# Patient Record
Sex: Male | Born: 1990 | Race: Black or African American | Hispanic: No | Marital: Single | Smoking: Never smoker
Health system: Southern US, Community
[De-identification: ages and names within clinical notes are randomized; demographics above are authoritative.]

---
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
## 2019-05-24 ENCOUNTER — Emergency Department
Admission: EM | Admit: 2019-05-24 | Discharge: 2019-05-24 | Disposition: A | Discharge status: Home / Self Care | Payer: No Typology Code available for payment source | Attending: Emergency Medicine | Admitting: Emergency Medicine

## 2019-05-24 ENCOUNTER — Other Ambulatory Visit: Payer: Self-pay

## 2019-05-24 ENCOUNTER — Encounter: Payer: Self-pay | Admitting: Emergency Medicine

## 2019-05-24 ENCOUNTER — Emergency Department: Payer: No Typology Code available for payment source

## 2019-05-24 DIAGNOSIS — U071 COVID-19: Secondary | ICD-10-CM | POA: Diagnosis not present

## 2019-05-24 DIAGNOSIS — M791 Myalgia, unspecified site: Secondary | ICD-10-CM | POA: Diagnosis present

## 2019-05-24 DIAGNOSIS — B349 Viral infection, unspecified: Secondary | ICD-10-CM

## 2019-05-24 LAB — CBC WITH DIFFERENTIAL/PLATELET
Abs Immature Granulocytes: 0.01 10*3/uL (ref 0.00–0.07)
Basophils Absolute: 0 10*3/uL (ref 0.0–0.1)
Basophils Relative: 0 %
Eosinophils Absolute: 0 10*3/uL (ref 0.0–0.5)
Eosinophils Relative: 0 %
HCT: 47.1 % (ref 39.0–52.0)
Hemoglobin: 15.1 g/dL (ref 13.0–17.0)
Immature Granulocytes: 0 %
Lymphocytes Relative: 18 %
Lymphs Abs: 0.8 10*3/uL (ref 0.7–4.0)
MCH: 25.9 pg — ABNORMAL LOW (ref 26.0–34.0)
MCHC: 32.1 g/dL (ref 30.0–36.0)
MCV: 80.9 fL (ref 80.0–100.0)
Monocytes Absolute: 0.8 10*3/uL (ref 0.1–1.0)
Monocytes Relative: 18 %
Neutro Abs: 2.9 10*3/uL (ref 1.7–7.7)
Neutrophils Relative %: 64 %
Platelets: 185 10*3/uL (ref 150–400)
RBC: 5.82 MIL/uL — ABNORMAL HIGH (ref 4.22–5.81)
RDW: 13.9 % (ref 11.5–15.5)
WBC: 4.5 10*3/uL (ref 4.0–10.5)
nRBC: 0 % (ref 0.0–0.2)

## 2019-05-24 LAB — BASIC METABOLIC PANEL
Anion gap: 9 (ref 5–15)
BUN: 10 mg/dL (ref 6–20)
CO2: 25 mmol/L (ref 22–32)
Calcium: 9 mg/dL (ref 8.9–10.3)
Chloride: 101 mmol/L (ref 98–111)
Creatinine, Ser: 0.97 mg/dL (ref 0.61–1.24)
GFR calc Af Amer: >60 mL/min (ref >60)
GFR calc non Af Amer: >60 mL/min (ref >60)
Glucose, Bld: 98 mg/dL (ref 70–99)
Potassium: 3.8 mmol/L (ref 3.5–5.1)
Sodium: 135 mmol/L (ref 135–145)

## 2019-05-24 LAB — TSH: TSH: 1.438 u[IU]/mL (ref 0.350–4.500)

## 2019-05-24 MED ORDER — KETOROLAC TROMETHAMINE 30 MG/ML IJ SOLN
30.0000 mg | Freq: Once | INTRAMUSCULAR | Status: AC
  Administered 2019-05-24: 15:00:00 30 mg via INTRAVENOUS
  Filled 2019-05-24: qty 1

## 2019-05-24 NOTE — ED Provider Notes (Signed)
[facility] Emergency Department Provider Note   ____________________________________________   First MD Initiated Contact with Patient 05/24/19 1410     (approximate)  I have reviewed the triage vital signs and the nursing notes.   HISTORY  Chief Complaint Generalized Body Aches    HPI Nicholas Flynn is a 29 y.o. male patient complain of joint pain and fatigue for few days.  Patient also complain of pruritus.  Patient denies cough or fever.  Patient denies nausea, vomiting, diarrhea.  Patient describes his pain as "achy".  Patient state pruritus that increases when his clothes rub against the skin.  No palliative measure for complaint.  Patient denies recent travel or known exposure to COVID-19.        History reviewed. No pertinent past medical history.  There are no problems to display for this patient.     Prior to Admission medications   Not on File    Allergies Patient has no known allergies.  No family history on file.  Social History Social History   Tobacco Use  . Smoking status: Never Smoker  . Smokeless tobacco: Never Used  Substance Use Topics  . Alcohol use: Yes  . Drug use: Not on file    Review of Systems Constitutional: No fever/chills Eyes: No visual changes. ENT: No sore throat. Cardiovascular: Denies chest pain. Respiratory: Denies shortness of breath. Gastrointestinal: No abdominal pain.  No nausea, no vomiting.  No diarrhea.  No constipation. Genitourinary: Negative for dysuria. Musculoskeletal: Negative for back pain. Skin: Negative for rash. Neurological: Negative for headaches, focal weakness or numbness.   ____________________________________________   PHYSICAL EXAM:  VITAL SIGNS: ED Triage Vitals  Enc Vitals Group     BP      Pulse      Resp      Temp      Temp src      SpO2      Weight      Height      Head Circumference      Peak Flow      Pain Score      Pain Loc      Pain  Edu?      Excl. in [place]?    Constitutional: Alert and oriented. Well appearing and in no acute distress. Neck: No stridor.  No cervical spine tenderness to palpation. Hematological/Lymphatic/Immunilogical: No cervical lymphadenopathy. Cardiovascular: Normal rate, regular rhythm. Grossly normal heart sounds.  Good peripheral circulation. Respiratory: Normal respiratory effort.  No retractions. Lungs CTAB. Gastrointestinal: Soft and nontender. No distention. No abdominal bruits. No CVA tenderness. Musculoskeletal: No lower extremity tenderness nor edema.  No joint effusions. Neurologic:  Normal speech and language. No gross focal neurologic deficits are appreciated. No gait instability. Skin:  Skin is warm, dry and intact. No rash noted. Psychiatric: Mood and affect are normal. Speech and behavior are normal.  ____________________________________________   LABS (all labs ordered are listed, but only abnormal results are displayed)  Labs Reviewed  CBC WITH DIFFERENTIAL/PLATELET - Abnormal; Notable for the following components:      Result Value   RBC 5.82 (*)    MCH 25.9 (*)    All other components within normal limits  SARS CORONAVIRUS 2 (TAT 6-24 HRS)  BASIC METABOLIC PANEL  TSH   ____________________________________________  EKG   ____________________________________________  RADIOLOGY  ED MD interpretation:    Official radiology report(s): DG Chest 2 View  Result Date: 05/24/2019 CLINICAL DATA:  Fatigue EXAM: CHEST - 2  VIEW COMPARISON:  November 30, 2008 FINDINGS: Lungs are clear. The heart size and pulmonary vascularity are normal. No adenopathy. No bone lesions. IMPRESSION: No edema or consolidation.  No evident adenopathy. Electronically Signed   By: Bretta Bang III M.D.   On: 05/24/2019 14:39    ____________________________________________   PROCEDURES  Procedure(s) performed (including Critical Care):  Procedures    ____________________________________________   INITIAL IMPRESSION / ASSESSMENT AND PLAN / ED COURSE  As part of my medical decision making, I reviewed the following data within the electronic MEDICAL RECORD NUMBER     Patient presents with generalized joint pain and fatigue for few days.  Patient denies URI signs or symptoms.  Discussed negative chest x-ray and lab results with patient.  Patient physical exam is unremarkable.  Patient given discharge care instruction and advised over-the-counter anti-inflammatory medications.  Patient advised self quarantine pending Covid test results.   Nicholas Flynn was evaluated in Emergency Department on 05/24/2019 for the symptoms described in the history of present illness. He was evaluated in the context of the global COVID-19 pandemic, which necessitated consideration that the patient might be at risk for infection with the SARS-CoV-2 virus that causes COVID-19. Institutional protocols and algorithms that pertain to the evaluation of patients at risk for COVID-19 are in a state of rapid change based on information released by regulatory bodies including the CDC and federal and state organizations. These policies and algorithms were followed during the patient's care in the ED.       ____________________________________________   FINAL CLINICAL IMPRESSION(S) / ED DIAGNOSES  Final diagnoses:  Viral illness     ED Discharge Orders    None       Note:  This document was prepared using Dragon voice recognition software and may include unintentional dictation errors.    Joni Reining, PA-C 05/24/19 1545    Emily Filbert, MD 05/25/19 1245

## 2019-05-24 NOTE — Discharge Instructions (Addendum)
Advised self quarantine pending results of COVID-19 test.  You may find the results in the MyChart app.

## 2019-05-24 NOTE — ED Triage Notes (Signed)
Presents with joint pain and fatigue for the past few days  No cough or fever

## 2019-05-25 ENCOUNTER — Telehealth: Payer: Self-pay | Admitting: Emergency Medicine

## 2019-05-25 LAB — SARS CORONAVIRUS 2 (TAT 6-24 HRS): SARS Coronavirus 2: POSITIVE — AB

## 2019-05-25 NOTE — Telephone Encounter (Signed)
Called patient to assure he is awre of covid result.  Left messgae with my number.

## 2019-11-08 ENCOUNTER — Emergency Department
Admission: EM | Admit: 2019-11-08 | Discharge: 2019-11-08 | Disposition: A | Discharge status: Home / Self Care | Payer: No Typology Code available for payment source | Attending: Emergency Medicine | Admitting: Emergency Medicine

## 2019-11-08 ENCOUNTER — Inpatient Hospital Stay
Admission: RE | Admit: 2019-11-08 | Discharge: 2019-11-10 | DRG: 885 | Disposition: A | Discharge status: Home / Self Care | Payer: No Typology Code available for payment source | Source: Intra-hospital | Attending: Psychiatry | Admitting: Psychiatry

## 2019-11-08 ENCOUNTER — Other Ambulatory Visit: Payer: Self-pay

## 2019-11-08 ENCOUNTER — Encounter: Payer: Self-pay | Admitting: Emergency Medicine

## 2019-11-08 ENCOUNTER — Encounter: Payer: Self-pay | Admitting: Psychiatry

## 2019-11-08 DIAGNOSIS — Z818 Family history of other mental and behavioral disorders: Secondary | ICD-10-CM | POA: Diagnosis not present

## 2019-11-08 DIAGNOSIS — U071 COVID-19: Secondary | ICD-10-CM | POA: Diagnosis not present

## 2019-11-08 DIAGNOSIS — Z20822 Contact with and (suspected) exposure to covid-19: Secondary | ICD-10-CM | POA: Diagnosis present

## 2019-11-08 DIAGNOSIS — F332 Major depressive disorder, recurrent severe without psychotic features: Secondary | ICD-10-CM | POA: Diagnosis present

## 2019-11-08 DIAGNOSIS — R45851 Suicidal ideations: Secondary | ICD-10-CM | POA: Insufficient documentation

## 2019-11-08 DIAGNOSIS — G4700 Insomnia, unspecified: Secondary | ICD-10-CM | POA: Diagnosis present

## 2019-11-08 DIAGNOSIS — F99 Mental disorder, not otherwise specified: Secondary | ICD-10-CM | POA: Diagnosis present

## 2019-11-08 LAB — SARS CORONAVIRUS 2 BY RT PCR (HOSPITAL ORDER, PERFORMED IN ~~LOC~~ HOSPITAL LAB): SARS Coronavirus 2: NEGATIVE

## 2019-11-08 LAB — URINE DRUG SCREEN, QUALITATIVE (ARMC ONLY)
Amphetamines, Ur Screen: NOT DETECTED
Barbiturates, Ur Screen: NOT DETECTED
Benzodiazepine, Ur Scrn: NOT DETECTED
Cannabinoid 50 Ng, Ur ~~LOC~~: NOT DETECTED
Cocaine Metabolite,Ur ~~LOC~~: POSITIVE — AB
MDMA (Ecstasy)Ur Screen: NOT DETECTED
Methadone Scn, Ur: NOT DETECTED
Opiate, Ur Screen: NOT DETECTED
Phencyclidine (PCP) Ur S: NOT DETECTED
Tricyclic, Ur Screen: NOT DETECTED

## 2019-11-08 LAB — CBC
HCT: 43.5 % (ref 39.0–52.0)
Hemoglobin: 14.2 g/dL (ref 13.0–17.0)
MCH: 26.7 pg (ref 26.0–34.0)
MCHC: 32.6 g/dL (ref 30.0–36.0)
MCV: 81.8 fL (ref 80.0–100.0)
Platelets: 218 10*3/uL (ref 150–400)
RBC: 5.32 MIL/uL (ref 4.22–5.81)
RDW: 13.7 % (ref 11.5–15.5)
WBC: 4.9 10*3/uL (ref 4.0–10.5)
nRBC: 0 % (ref 0.0–0.2)

## 2019-11-08 LAB — COMPREHENSIVE METABOLIC PANEL
ALT: 29 U/L (ref 0–44)
AST: 22 U/L (ref 15–41)
Albumin: 4.4 g/dL (ref 3.5–5.0)
Alkaline Phosphatase: 73 U/L (ref 38–126)
Anion gap: 9 (ref 5–15)
BUN: 17 mg/dL (ref 6–20)
CO2: 27 mmol/L (ref 22–32)
Calcium: 9.4 mg/dL (ref 8.9–10.3)
Chloride: 102 mmol/L (ref 98–111)
Creatinine, Ser: 0.99 mg/dL (ref 0.61–1.24)
GFR calc Af Amer: >60 mL/min (ref >60)
GFR calc non Af Amer: >60 mL/min (ref >60)
Glucose, Bld: 82 mg/dL (ref 70–99)
Potassium: 4 mmol/L (ref 3.5–5.1)
Sodium: 138 mmol/L (ref 135–145)
Total Bilirubin: 0.8 mg/dL (ref 0.3–1.2)
Total Protein: 7.7 g/dL (ref 6.5–8.1)

## 2019-11-08 LAB — ACETAMINOPHEN LEVEL: Acetaminophen (Tylenol), Serum: <10 ug/mL — ABNORMAL LOW (ref 10–30)

## 2019-11-08 LAB — SALICYLATE LEVEL: Salicylate Lvl: <7 mg/dL — ABNORMAL LOW (ref 7.0–30.0)

## 2019-11-08 LAB — ETHANOL: Alcohol, Ethyl (B): <10 mg/dL (ref <10)

## 2019-11-08 MED ORDER — ACETAMINOPHEN 325 MG PO TABS: 650.0000 mg | ORAL_TABLET | Freq: Four times a day (QID) | ORAL | Status: DC | PRN

## 2019-11-08 MED ORDER — ALUM & MAG HYDROXIDE-SIMETH 200-200-20 MG/5ML PO SUSP: 30.0000 mL | ORAL | Status: DC | PRN

## 2019-11-08 MED ORDER — PNEUMOCOCCAL VAC POLYVALENT 25 MCG/0.5ML IJ INJ
0.5000 mL | INJECTION | INTRAMUSCULAR | Status: DC
  Filled 2019-11-08: qty 0.5

## 2019-11-08 MED ORDER — MAGNESIUM HYDROXIDE 400 MG/5ML PO SUSP: 30.0000 mL | Freq: Every day | ORAL | Status: DC | PRN

## 2019-11-08 MED ORDER — TRAZODONE HCL 50 MG PO TABS
50.0000 mg | ORAL_TABLET | Freq: Every evening | ORAL | Status: DC | PRN
  Administered 2019-11-09: 50 mg via ORAL
  Filled 2019-11-08 (×2): qty 1

## 2019-11-08 NOTE — Tx Team (Signed)
Initial Treatment Plan 11/08/2019 10:55 PM Nicholas Flynn DYJ:[id number]    PATIENT STRESSORS: Substance abuse   PATIENT STRENGTHS: Capable of independent living Wellsite geologist fund of knowledge Motivation for treatment/growth   PATIENT IDENTIFIED PROBLEMS: Depression  Suicidal Ideation  Substance abuse                 DISCHARGE CRITERIA:  Improved stabilization in mood, thinking, and/or behavior  PRELIMINARY DISCHARGE PLAN: Outpatient therapy  PATIENT/FAMILY INVOLVEMENT: This treatment plan has been presented to and reviewed with the patient, Nicholas Flynn, and/or family member, .  The patient and family have been given the opportunity to ask questions and make suggestions.  Shelia Media, RN 11/08/2019, 10:55 PM

## 2019-11-08 NOTE — ED Notes (Addendum)
Informed by offgoing nurse that pt has bed assignment in Behavioral unit of [facility]. Informed report could be called after 8PM

## 2019-11-08 NOTE — Progress Notes (Signed)
Patient admitted to unit. Pleasant and cooperative. Currently denies SI, HI, IVH. Patient reports has struggled with depression since 29 years old, reports at that age he had written a letter to his mom about how sad he was, also reports having therapy around 16 or 17 for sleep and emotional stress. Patient reports having highs and lows most of his life. Reports he went to [employer] and that helped some due to routine. 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 Reports he hit an all time low on this past Saturday and it scared him. Reports he was at work and was having a brain fog, went outside and felt like something was telling him if he could walk into traffic and end his life. Reports he told his mom about it and the only direction they knew to take was to come to the ED. Reports has never been on depression meds before. Pt currently denies SI, but reports feeling sad and depressed. Pt reports has experienced anhedonia lately and that all he does is eat, sleep and drink. Reports his drinking has slowed down but reportedly drinks 3-4 times a week, maybe 6 beer or so or sometimes shots of liquor. Oriented patient to room and unit, snacks provided. Pt reports would like a good night sleep, requested sleep aid. Writer contacted NP, patient was sleep prior to giving med. Skin and contraband search completed and witnessed by Liborio Nixon, MHT. No skin issues noted, no contraband found. Pt remains safe on unit with q 15 min checks.

## 2019-11-08 NOTE — ED Notes (Signed)
Pt dressed out. Pt belongings bag:  1 pair red shoes 1 pair blue socks 1 blue underwear 1 red pants 1 white shirt 1 black hat 1 cellphone 1 wallet with cash 1 set of keys 1 headphones  All belongings placed in pt's own black bag.  Pt has glasses with him.

## 2019-11-08 NOTE — ED Notes (Signed)
Pt given meal tray and drink.

## 2019-11-08 NOTE — ED Notes (Signed)
BMU requests patient to not come down until 9:30

## 2019-11-08 NOTE — ED Provider Notes (Signed)
[facility] Emergency Department Provider Note   ____________________________________________   First MD Initiated Contact with Patient 11/08/19 1532     (approximate)  I have reviewed the triage vital signs and the nursing notes.   HISTORY  Chief Complaint Psychiatric Evaluation    HPI Nicholas Flynn is a 29 y.o. male with no significant past medical history who presents to the ED complaining of suicidal ideation.  Patient reports he has been "sad" for a long time and has had intermittent thoughts of suicide for the past several months.  He states that 2 days ago his thoughts became darker than they ever had before and he actually considered going through with it.  He denies any specific plan but has been unable to focus for the past 2 days due to ongoing thoughts of ending his life.  He admits to regular alcohol consumption, about 3 to 4 days/week, also endorses occasional drug use including marijuana, cocaine, mushrooms, and LSD.  He denies any significant medical history and does not have any medical complaints at this time.        History reviewed. No pertinent past medical history.  There are no problems to display for this patient.   History reviewed. No pertinent surgical history.  Prior to Admission medications   Not on File    Allergies Patient has no known allergies.  History reviewed. No pertinent family history.  Social History Social History   Tobacco Use  . Smoking status: Never Smoker  . Smokeless tobacco: Never Used  Vaping Use  . Vaping Use: Former  Substance Use Topics  . Alcohol use: Yes  . Drug use: Not on file    Review of Systems  Constitutional: No fever/chills Eyes: No visual changes. ENT: No sore throat. Cardiovascular: Denies chest pain. Respiratory: Denies shortness of breath. Gastrointestinal: No abdominal pain.  No nausea, no vomiting.  No diarrhea.  No constipation. Genitourinary: Negative for  dysuria. Musculoskeletal: Negative for back pain. Skin: Negative for rash. Neurological: Negative for headaches, focal weakness or numbness.  Positive for suicidal ideation.  ____________________________________________   PHYSICAL EXAM:  VITAL SIGNS: ED Triage Vitals  Enc Vitals Group     BP 11/08/19 1156 (!) 146/94     Pulse Rate 11/08/19 1156 88     Resp 11/08/19 1156 16     Temp 11/08/19 1156 98.2 F (36.8 C)     Temp Source 11/08/19 1156 Oral     SpO2 11/08/19 1156 100 %     Weight 11/08/19 1153 220 lb (99.8 kg)     Height 11/08/19 1153 5\' 11"  (1.803 m)     Head Circumference --      Peak Flow --      Pain Score 11/08/19 1153 0     Pain Loc --      Pain Edu? --      Excl. in [place]? --     Constitutional: Alert and oriented. Eyes: Conjunctivae are normal. Head: Atraumatic. Nose: No congestion/rhinnorhea. Mouth/Throat: Mucous membranes are moist. Neck: Normal ROM Cardiovascular: Normal rate, regular rhythm. Grossly normal heart sounds. Respiratory: Normal respiratory effort.  No retractions. Lungs CTAB. Gastrointestinal: Soft and nontender. No distention. Genitourinary: deferred Musculoskeletal: No lower extremity tenderness nor edema. Neurologic:  Normal speech and language. No gross focal neurologic deficits are appreciated. Skin:  Skin is warm, dry and intact. No rash noted. Psychiatric: Mood and affect are normal. Speech and behavior are normal.  ____________________________________________   LABS (all labs ordered  are listed, but only abnormal results are displayed)  Labs Reviewed  SALICYLATE LEVEL - Abnormal; Notable for the following components:      Result Value   Salicylate Lvl <7.0 (*)    All other components within normal limits  ACETAMINOPHEN LEVEL - Abnormal; Notable for the following components:   Acetaminophen (Tylenol), Serum <10 (*)    All other components within normal limits  SARS CORONAVIRUS 2 BY RT PCR (HOSPITAL ORDER, PERFORMED IN [facility])  COMPREHENSIVE METABOLIC PANEL  ETHANOL  CBC  URINE DRUG SCREEN, QUALITATIVE ([facility] ONLY)    PROCEDURES  Procedure(s) performed (including Critical Care):  Procedures   ____________________________________________   INITIAL IMPRESSION / ASSESSMENT AND PLAN / ED COURSE       29 year old male with no significant past medical history presents to the ED with increasing depression and thoughts of suicide over the past couple of months, thought more specifically about suicide 2 days ago but denies any specific plan.  He arrives voluntarily and is calm and cooperative, we will maintain voluntary status.  He denies any medical complaints and screening labs are unremarkable, he is medically cleared for psychiatric evaluation.  Patient was evaluated by psychiatry and they will plan for voluntary admission.  The patient has been placed in psychiatric observation due to the need to provide a safe environment for the patient while obtaining psychiatric consultation and evaluation, as well as ongoing medical and medication management to treat the patient's condition.  The patient has not been placed under full IVC at this time.       ____________________________________________   FINAL CLINICAL IMPRESSION(S) / ED DIAGNOSES  Final diagnoses:  Suicidal ideation     ED Discharge Orders    None       Note:  This document was prepared using Dragon voice recognition software and may include unintentional dictation errors.   Chesley Noon, MD 11/08/19 [phone]

## 2019-11-08 NOTE — BH Assessment (Signed)
Assessment Note  Nicholas Flynn is an 29 y.o. male who presents to the [facility] ED voluntarily for treatment. Per triage note, Pt here for SI without plan.  Has been increasing in these thoughts over last couple months.  During TTS assessment pt presents is alert and oriented x 4, anxious but cooperative, and mood-congruent with affect. The pt does not appear to be responding to internal or external stimuli. Neither is the pt presenting with any delusional thinking. Pt confirmed the information provided to the triage RN and identified his main complaint to be depression. Pt reports a hx of depression for "as long as he can remember". Pt reports recent struggles with "intense dark thoughts" the last few days. Pt reports the symptom to start Saturday and to have increased since resulting to his ED visit. Pt describes experiencing thoughts of no longer wanting to live, uncontrollable sobbing and a lack of interest in daily activities for unknown reasons. Pt denies a plan or hx of attempts and stated "I usually can shake off my dark thoughts but for some reason Saturday I just couldn't". Pt reports after talking to his mom regarding his SI and the need for OPT, mom advised him to come to the ED for assistance. Pt denies an INPT/OPT/ MH Hx. Pt reports occasional substance use of cocaine, acid, marijuana, alcohol and mushrooms. Pt identified his primary substance choice to be alcohol and his last consumption to be 2 days ago (2 glasses of wine, couple beers). Pt reports his last use of the other substances listed to be last Thursday but was unsure of the quantity. Pt reports struggles with insomnia for years and to be currently sleeping 3-4 hours a night. Pt reports a family hx of dementia, depression and anxiety but denies a SA hx. Pt denies any current HI/AH/VH and provided his mother Christapher Gillian, [phone]) as his collateral contact.   Per Dr. Smith Robert pt meets criteria for INPT  Diagnosis: Major Depressive  Disorder, Recurrent Episode, Severe   Past Medical History: History reviewed. No pertinent past medical history.  History reviewed. No pertinent surgical history.  Family History: History reviewed. No pertinent family history.  Social History:  reports that he has never smoked. He has never used smokeless tobacco. He reports current alcohol use. No history on file for drug use.  Additional Social History:  Alcohol / Drug Use Pain Medications: see mar Prescriptions: see mar Over the Counter: see mar History of alcohol / drug use?: Yes Substance #1 Name of Substance 1: Marijuana Substance #2 Name of Substance 2: Alcohol Substance #3 Name of Substance 3: Mushrooms & Acid Substance #4 Name of Substance 4: Cocaine  CIWA: CIWA-Ar BP: (!) 146/94 Pulse Rate: 88 COWS:    Allergies: No Known Allergies  Home Medications: (Not in a hospital admission)   OB/GYN Status:  No LMP for male patient.  General Assessment Data Location of Assessment: [facility] ED TTS Assessment: In system Is this a Tele or Face-to-Face Assessment?: Face-to-Face Is this an Initial Assessment or a Re-assessment for this encounter?: Initial Assessment Patient Accompanied by:: N/A Language Other than English: No Living Arrangements: Other (Comment) (Private home) What gender do you identify as?: Male Marital status: Single Maiden name: n/a Pregnancy Status: No Living Arrangements: Parent Can pt return to current living arrangement?: Yes Admission Status: Voluntary Is patient capable of signing voluntary admission?: Yes Referral Source: Self/Family/Friend Insurance type: VA  Medical Screening Exam [facility] Walk-in ONLY) Medical Exam completed: Yes  Crisis Care Plan Living Arrangements:  Parent Legal Guardian:  (self ) Name of Psychiatrist: None reported  Name of Therapist: None reported   Education Status Is patient currently in school?: No Is the patient employed, unemployed or receiving disability?:  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 : No Suicidal Intent: Yes-Currently Present Has patient had any suicidal intent within the past 6 months prior to admission? : No Is patient at risk for suicide?: No Suicidal Plan?: No Has patient had any suicidal plan within the past 6 months prior to admission? : No Access to Means: No What has been your use of drugs/alcohol within the last 12 months?: Alcohol, Cocaine, Acid, Mushrooms, Marijuana  Previous Attempts/Gestures: No How many times?: 0 Other Self Harm Risks: None reported  Triggers for Past Attempts: None known Intentional Self Injurious Behavior: None Family Suicide History: Unknown Recent stressful life event(s): Other (Comment) (Life stressors ) Persecutory voices/beliefs?: No Depression: Yes Depression Symptoms: Insomnia, Loss of interest in usual pleasures Substance abuse history and/or treatment for substance abuse?: No Suicide prevention information given to non-admitted patients: Yes  Risk to Others within the past 6 months Homicidal Ideation: No Does patient have any lifetime risk of violence toward others beyond the six months prior to admission? : No Thoughts of Harm to Others: No Current Homicidal Intent: No Current Homicidal Plan: No Access to Homicidal Means: No Describe Access to Homicidal Means: n/a Identified Victim: n/a History of harm to others?: No Assessment of Violence: None Noted Violent Behavior Description: n/a Does patient have access to weapons?: No Criminal Charges Pending?: Yes Describe Pending Criminal Charges: DUI Does patient have a court date: Yes Court Date: 11/30/19 Is patient on probation?: Unknown  Psychosis Hallucinations: None noted Delusions: None noted  Mental Status Report Appearance/Hygiene: In  scrubs Eye Contact: Fair Motor Activity: Freedom of movement Speech: Logical/coherent Level of Consciousness: Alert Mood: Depressed, Anxious, Pleasant Affect: Anxious, Depressed Anxiety Level: Minimal Thought Processes: Coherent, Relevant Judgement: Partial Orientation: Appropriate for developmental age Obsessive Compulsive Thoughts/Behaviors: None  Cognitive Functioning Concentration: Good Memory: Recent Intact, Remote Intact Is patient IDD: No Insight: Fair Impulse Control: Good Appetite: Good Have you had any weight changes? : No Change Sleep: Decreased Total Hours of Sleep:  (4-5 hrs) Vegetative Symptoms: None     Prior Inpatient Therapy Prior Inpatient Therapy: No Prior Therapy Dates: None reported  Prior Therapy Facilty/Provider(s): None reported  Reason for Treatment: None reported   Prior Outpatient Therapy Prior Outpatient Therapy: No Does patient have an ACCT team?: No Does patient have Intensive In-House Services?  : No Does patient have [facility] services? : Unknown Does patient have P4CC services?: Unknown  ADL Screening (condition at time of admission) Is the patient deaf or have difficulty hearing?: No Does the patient have difficulty seeing, even when wearing glasses/contacts?: No Does the patient have difficulty concentrating, remembering, or making decisions?: No Does the patient have difficulty dressing or bathing?: No Does the patient have difficulty walking or climbing stairs?: No Weakness of Legs: None Weakness of Arms/Hands: None  Home Assistive Devices/Equipment Home Assistive Devices/Equipment: None  Therapy Consults (therapy consults require a physician order) PT Evaluation Needed: No OT Evalulation Needed: No SLP Evaluation Needed: No Abuse/Neglect Assessment (Assessment to be complete while patient is alone) Abuse/Neglect Assessment Can Be Completed: Yes Physical Abuse: Denies Verbal Abuse: Denies Sexual Abuse: Denies  Exploitation  of patient/patient's resources: Denies Self-Neglect: Denies Values / Beliefs Cultural Requests During Hospitalization: None Spiritual Requests During Hospitalization: None Consults Spiritual Care Consult Needed: No Transition of Care Team Consult Needed: No Advance Directives (For Healthcare) Does Patient Have a Medical Advance Directive?: No          Disposition:  Disposition Initial Assessment Completed for this Encounter: Yes Patient referred to: Other (Comment)  On Site Evaluation by:   Reviewed with Physician:    Shanon Ace 11/08/2019 5:25 PM

## 2019-11-08 NOTE — BH Assessment (Signed)
Patient can come down after 8pm  Patient is to be admitted to [facility] BMU by Dr. Toni Amend.  Attending Physician will be. Dr. Toni Amend.   Patient has been assigned to room 303, by [facility] Charge Nurse Demetria, RN.  Intake Paper Work has been signed and placed on patient chart.  ER staff is aware of the admission: 1. Nitchia, ER Secretary  2. Larinda Buttery, ER MD  3. Annette Stable, Patient's Nurse  4. THO Patient Access.

## 2019-11-08 NOTE — ED Notes (Signed)
Pt a/o. Voices having suicidal thoughts in the back of his mind for several months. Pt reports saturday at work he was crying and could not stop thinking about hurting self. Pt denies precipitating event. While pt was talking about how he has been feeling he said that he has always been sad. 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 Pt able to contract for safety at this time.

## 2019-11-08 NOTE — Consult Note (Signed)
[facility] Face-to-Face Psychiatry Consult   Reason for Consult:  Worsening depression dual diagnosis and cannot stay safe   Referring Physician:   ER MD    Patient Identification: Nicholas Flynn MRN:  [id number] Principal Diagnosis:   Major Depression severe recurrent  Polysubstance abuse and dependence   Diagnosis:  Same   AA male now not able to stay safe, voluntary seeking admission for dual diagnosis and where major depression issues are worsening.  Poor coping and social skills   Total Time spent with patient:   30-40 min    Subjective:   Nicholas Flynn is a 29 y.o. male patient admitted with   Issues with major depression and substance issues   HPI:  He feels a few weeks of worsening depressed mood, crying spells hopeless helpless feelings, lack of energy, motivation and related issues and alternates between active and passive SI  But no specific plans   Also has excessive worry, nervousness tension frustration.   No other previous treatment is somewhat vague but admits to hallucinogen, cocaine and at times meth and Marijuana use.  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 No pertinent past medical history. History reviewed. No pertinent surgical history. Family History: History reviewed. No pertinent family history. Family Psychiatric  History:  Social History:  Social History   Substance and Sexual Activity  Alcohol Use Yes     Social History   Substance and Sexual Activity  Drug Use Not on file     Social History   Socioeconomic History  . Marital status: Single    Spouse name: Not on file  . Number of children: Not on file  . Years of education: Not on file  . Highest education level: Not on file  Occupational History  . Not on file  Tobacco Use  . Smoking status: Never Smoker  . Smokeless tobacco: Never Used  Vaping Use  . Vaping Use: Former  Substance and Sexual Activity  . Alcohol use: Yes  . Drug use: Not on file  . Sexual activity: Not on file  Other Topics Concern  . Not on file  Social History Narrative  . Not on file   Social Determinants of Health   Financial Resource Strain:   . Difficulty of Paying Living Expenses:   Food Insecurity:   . Worried About Programme researcher, broadcasting/film/video in the Last Year:   . Barista in the Last Year:   Transportation Needs:   . Freight forwarder (Medical):   Marland Kitchen Lack of Transportation (Non-Medical):   Physical Activity:   . Days of Exercise per Week:   . Minutes of Exercise per Session:   Stress:   . Feeling of Stress :   Social Connections:   . Frequency of Communication with Friends and Family:   . Frequency of Social Gatherings with Friends and Family:   . Attends Religious Services:   . Active Member of Clubs or Organizations:   . Attends Club or  Organization Meetings:   Marland Kitchen Marital Status:    Additional Social History:    Allergies:  No Known Allergies  Labs:  Results for orders placed or performed during the hospital encounter of 11/08/19 (from the past 48 hour(s))  Comprehensive metabolic panel     Status: None   Collection Time: 11/08/19 12:00 PM  Result Value Ref Range   Sodium 138 135 - 145 mmol/L   Potassium 4.0 3.5 - 5.1 mmol/L   Chloride 102 98 - 111 mmol/L   CO2 27 22 - 32 mmol/L   Glucose, Bld 82 70 - 99 mg/dL    Comment: Glucose reference range applies only to samples taken after fasting for at least 8 hours.   BUN 17 6 - 20 mg/dL   Creatinine, Ser 4.23 0.61 - 1.24 mg/dL   Calcium 9.4 8.9 -  53.6 mg/dL   Total Protein 7.7 6.5 - 8.1 g/dL   Albumin 4.4 3.5 - 5.0 g/dL   AST 22 15 - 41 U/L   ALT 29 0 - 44 U/L   Alkaline Phosphatase 73 38 - 126 U/L   Total Bilirubin 0.8 0.3 - 1.2 mg/dL   GFR calc non Af Amer >60 >60 mL/min   GFR calc Af Amer >60 >60 mL/min   Anion gap 9 5 - 15    Comment: Performed at [facility], [address] [place], [place] [address]  Ethanol     Status: None   Collection Time: 11/08/19 12:00 PM  Result Value Ref Range   Alcohol, Ethyl (B) <10 <10 mg/dL    Comment: (NOTE) Lowest detectable limit for serum alcohol is 10 mg/dL.  For medical purposes only. Performed at [facility], [address] [place], [place] [address]   Salicylate level     Status: Abnormal   Collection Time: 11/08/19 12:00 PM  Result Value Ref Range   Salicylate Lvl <7.0 (L) 7.0 - 30.0 mg/dL    Comment: Performed at [facility], [address] [place], [place] [address]  Acetaminophen level     Status: Abnormal   Collection Time: 11/08/19 12:00 PM  Result Value Ref Range   Acetaminophen (Tylenol), Serum <10 (L) 10 - 30 ug/mL    Comment: (NOTE) Therapeutic concentrations vary significantly. A range of 10-30 ug/mL  may be an effective concentration for many patients. However, some  are best treated at concentrations outside of this range. Acetaminophen concentrations >150 ug/mL at 4 hours after ingestion  and >50 ug/mL at 12 hours after ingestion are often associated with  toxic reactions.  Performed at [facility], [address] [place], [place] [address]   cbc     Status: None   Collection Time: 11/08/19 12:00 PM  Result Value Ref Range   WBC 4.9 4.0 - 10.5 K/uL   RBC 5.32 4.22 - 5.81 MIL/uL   Hemoglobin 14.2 13.0 - 17.0 g/dL   HCT 67.1 39 - 52 %   MCV 81.8 80.0 - 100.0 fL   MCH 26.7 26.0 - 34.0 pg   MCHC 32.6 30.0 - 36.0 g/dL   RDW 24.5 80.9 - 98.3 %   Platelets 218 150 - 400 K/uL   nRBC 0.0 0.0 - 0.2 %     Comment: Performed at [facility], [address] [place], [place] [address]    No current facility-administered medications for this encounter.   No current outpatient medications on file.    Musculoskeletal: Strength & Muscle Tone:  Normal  Gait & Station: normal  Patient leans:  N/a   Psychiatric Specialty Exam: Physical Exam  Review of Systems  Blood pressure (!) 146/94, pulse 88, temperature 98.2 F (36.8 C), temperature source Oral, resp. rate 16, height 5\' 11"  (1.803 m), weight 99.8 kg, SpO2 100 %.Body mass index is 30.68 kg/m.    Mental Status     Alert cooperative oriented to person place date and time  Consciousness not clouded or fluctuant Concentration and attention normal Mood depressed Affect okay No movement problems Thought process and content --no frank delusions illusions, hallucinations --has depressive and anxious themes Judgement insight reliability fair  Memory remote recent and immediate intact through general questions Fund of knowledge intelligence average to below average Abstraction normal   Speech normal rate tone volume fluency SI and HI --unclear safety margin, strong enough for admission on voluntary basis,   Discharge could put him at risk                                                    Treatment Plan Summary:  AA male with dual diagnosis seeks voluntary admission for depression and substance use   Disposition:  Most likely admission to our unit here at [facility]   [facility], MD 11/08/2019 3:44 PM

## 2019-11-08 NOTE — ED Notes (Signed)
Pt given phone to update mom on plan

## 2019-11-08 NOTE — ED Triage Notes (Signed)
Pt here for SI without plan.  Has been increasing in these thoughts over last couple months.  NAD.

## 2019-11-09 DIAGNOSIS — F332 Major depressive disorder, recurrent severe without psychotic features: Principal | ICD-10-CM

## 2019-11-09 MED ORDER — FLUOXETINE HCL 20 MG PO CAPS
20.0000 mg | ORAL_CAPSULE | Freq: Every day | ORAL | Status: DC
  Administered 2019-11-09 – 2019-11-10 (×2): 20 mg via ORAL
  Filled 2019-11-09 (×2): qty 1

## 2019-11-09 NOTE — H&P (Signed)
Psychiatric Admission Assessment Adult  Patient Identification: Nicholas Flynn MRN:  [id number] Date of Evaluation:  11/09/2019 Chief Complaint:  Severe recurrent major depression without psychotic features (HCC) [F33.2] Principal Diagnosis: Severe recurrent major depression without psychotic features (HCC) Diagnosis:  Principal Problem:   Severe recurrent major depression without psychotic features (HCC)  History of Present Illness: Patient seen and chart reviewed.  29 year old man presented voluntarily to the emergency room seeking mental health treatment.  He reports to me that he has had symptoms of depression going back to adolescence.  Always has felt down and negative much of the time.  The last few months for what ever reason he has been feeling worse.  Mood stays nervous down and hopeless much of the time.  Energy is lower.  Feels a lack of motivation and some hopelessness.  On Saturday at work for reasons he cannot define he felt significantly worse and was having suicidal thoughts but did not act on it.  He is not currently receiving any mental health treatment.  He reports that he drinks a few times a week and drinks a fair bit when he does drink but that is less than he used to.  Denies using other drugs regularly although he occasionally uses hallucinogens.  He did have cocaine in his drug screen.  Patient does not have very much in the way of objective life stresses.  He is working steadily and has a decent place to live and supportive family. Associated Signs/Symptoms: Depression Symptoms:  depressed mood, insomnia, feelings of worthlessness/guilt, difficulty concentrating, hopelessness, suicidal thoughts without plan, anxiety, loss of energy/fatigue, disturbed sleep, (Hypo) Manic Symptoms:  None reported Anxiety Symptoms:  Excessive Worry, Psychotic Symptoms:  None reported PTSD Symptoms: Negative Total Time spent with patient: 1 hour  Past Psychiatric History: Patient  says he saw a therapist as a teenager.  No hospitalization no suicide attempts.  No previous medication.  He says he attended AA meetings a few times while he was in the [employer].  Has not had any psychiatric treatment since then.  Is the patient at risk to self? Yes.    Has the patient been a risk to self in the past 6 months? Yes.    Has the patient been a risk to self within the distant past? Yes.    Is the patient a risk to others? No.  Has the patient been a risk to others in the past 6 months? No.  Has the patient been a risk to others within the distant past? No.   Prior Inpatient Therapy:   Prior Outpatient Therapy:    Alcohol Screening: 1. How often do you have a drink containing alcohol?: 2 to 3 times a week 2. How many drinks containing alcohol do you have on a typical day when you are drinking?: 5 or 6 3. How often do you have six or more drinks on one occasion?: Less than monthly AUDIT-C Score: 6 4. How often during the last year have you found that you were not able to stop drinking once you had started?: Never 5. How often during the last year have you failed to do what was normally expected from you because of drinking?: Never 6. How often during the last year have you needed a first drink in the morning to get yourself going after a heavy drinking session?: Never 7. How often during the last year have you had a feeling of guilt of remorse after drinking?: Never 8. How often during the last  year have you been unable to remember what happened the night before because you had been drinking?: Never 9. Have you or someone else been injured as a result of your drinking?: No 10. Has a relative or friend or a doctor or another health worker been concerned about your drinking or suggested you cut down?: Yes, but not in the last year Alcohol Use Disorder Identification Test Final Score (AUDIT): 8 Alcohol Brief Interventions/Follow-up: Brief Advice Substance Abuse History in the last  12 months:  Yes.   Consequences of Substance Abuse: Negative Previous Psychotropic Medications: No  Psychological Evaluations: No  Past Medical History: History reviewed. No pertinent past medical history. History reviewed. No pertinent surgical history. Family History: History reviewed. No pertinent family history. Family Psychiatric  History: Patient reports a pretty extensive family history of depression and anxiety but no known suicide attempts no history of psychosis Tobacco Screening: Have you used any form of tobacco in the last 30 days? (Cigarettes, Smokeless Tobacco, Cigars, and/or Pipes): Yes Tobacco use, Select all that apply: smokeless tobacco use daily Are you interested in Tobacco Cessation Medications?: No, patient refused Counseled patient on smoking cessation including recognizing danger situations, developing coping skills and basic information about quitting provided: Yes Social History:  Social History   Substance and Sexual Activity  Alcohol Use Yes     Social History   Substance and Sexual Activity  Drug Use Not on file    Additional Social History: Marital status: Divorced Divorced, when?: "2014" What types of issues is patient dealing with in the relationship?: "she just changed her mind" Does patient have children?: No                         Allergies:  No Known Allergies Lab Results:  Results for orders placed or performed during the hospital encounter of 11/08/19 (from the past 48 hour(s))  Comprehensive metabolic panel     Status: None   Collection Time: 11/08/19 12:00 PM  Result Value Ref Range   Sodium 138 135 - 145 mmol/L   Potassium 4.0 3.5 - 5.1 mmol/L   Chloride 102 98 - 111 mmol/L   CO2 27 22 - 32 mmol/L   Glucose, Bld 82 70 - 99 mg/dL    Comment: Glucose reference range applies only to samples taken after fasting for at least 8 hours.   BUN 17 6 - 20 mg/dL   Creatinine, Ser 1.61 0.61 - 1.24 mg/dL   Calcium 9.4 8.9 - 09.6 mg/dL    Total Protein 7.7 6.5 - 8.1 g/dL   Albumin 4.4 3.5 - 5.0 g/dL   AST 22 15 - 41 U/L   ALT 29 0 - 44 U/L   Alkaline Phosphatase 73 38 - 126 U/L   Total Bilirubin 0.8 0.3 - 1.2 mg/dL   GFR calc non Af Amer >60 >60 mL/min   GFR calc Af Amer >60 >60 mL/min   Anion gap 9 5 - 15    Comment: Performed at [facility], [address] [place], [place] [address]  Ethanol     Status: None   Collection Time: 11/08/19 12:00 PM  Result Value Ref Range   Alcohol, Ethyl (B) <10 <10 mg/dL    Comment: (NOTE) Lowest detectable limit for serum alcohol is 10 mg/dL.  For medical purposes only. Performed at [facility], [address] [place], [place] [address]   Salicylate level     Status: Abnormal  Collection Time: 11/08/19 12:00 PM  Result Value Ref Range   Salicylate Lvl <7.0 (L) 7.0 - 30.0 mg/dL    Comment: Performed at [facility], [address] [place], [place] [address]  Acetaminophen level     Status: Abnormal   Collection Time: 11/08/19 12:00 PM  Result Value Ref Range   Acetaminophen (Tylenol), Serum <10 (L) 10 - 30 ug/mL    Comment: (NOTE) Therapeutic concentrations vary significantly. A range of 10-30 ug/mL  may be an effective concentration for many patients. However, some  are best treated at concentrations outside of this range. Acetaminophen concentrations >150 ug/mL at 4 hours after ingestion  and >50 ug/mL at 12 hours after ingestion are often associated with  toxic reactions.  Performed at [facility], [address] [place], [place] [address]   cbc     Status: None   Collection Time: 11/08/19 12:00 PM  Result Value Ref Range   WBC 4.9 4.0 - 10.5 K/uL   RBC 5.32 4.22 - 5.81 MIL/uL   Hemoglobin 14.2 13.0 - 17.0 g/dL   HCT 09.843.5 39 - 52 %   MCV 81.8 80.0 - 100.0 fL   MCH 26.7 26.0 - 34.0 pg   MCHC 32.6 30.0 - 36.0 g/dL   RDW 11.913.7 14.711.5 - 82.915.5 %   Platelets 218 150 - 400 K/uL   nRBC 0.0 0.0 - 0.2 %    Comment: Performed  at [facility], [address] [place], [place] [address]  SARS Coronavirus 2 by RT PCR (hospital order, performed in [facility] hospital lab) Nasopharyngeal Nasopharyngeal Swab     Status: None   Collection Time: 11/08/19  5:19 PM   Specimen: Nasopharyngeal Swab  Result Value Ref Range   SARS Coronavirus 2 NEGATIVE NEGATIVE    Comment: (NOTE) SARS-CoV-2 target nucleic acids are NOT DETECTED.  The SARS-CoV-2 RNA is generally detectable in upper and lower respiratory specimens during the acute phase of infection. The lowest concentration of SARS-CoV-2 viral copies this assay can detect is 250 copies / mL. A negative result does not preclude SARS-CoV-2 infection and should not be used as the sole basis for treatment or other patient management decisions.  A negative result may occur with improper specimen collection / handling, submission of specimen other than nasopharyngeal swab, presence of viral mutation(s) within the areas targeted by this assay, and inadequate number of viral copies (<250 copies / mL). A negative result must be combined with clinical observations, patient history, and epidemiological information.  Fact Sheet for Patients:   BoilerBrush.com.cyhttps://www.fda.gov/media/136312/download  Fact Sheet for Healthcare Providers: https://pope.com/https://www.fda.gov/media/136313/download  This test is not yet approved or  cleared by the [place] FDA and has been authorized for detection and/or diagnosis of SARS-CoV-2 by FDA under an Emergency Use Authorization (EUA).  This EUA will remain in effect (meaning this test can be used) for the duration of the COVID-19 declaration under Section 564(b)(1) of the Act, 21 U.S.C. section 360bbb-3(b)(1), unless the authorization is terminated or revoked sooner.  Performed at [facility], [address] [place], [place] [address]   Urine Drug Screen, Qualitative     Status: Abnormal   Collection Time: 11/08/19  5:50 PM  Result Value  Ref Range   Tricyclic, Ur Screen NONE DETECTED NONE DETECTED   Amphetamines, Ur Screen NONE DETECTED NONE DETECTED   MDMA (Ecstasy)Ur Screen NONE DETECTED NONE DETECTED   Cocaine Metabolite,Ur [place] POSITIVE (A) NONE DETECTED   Opiate, Ur Screen NONE  DETECTED NONE DETECTED   Phencyclidine (PCP) Ur S NONE DETECTED NONE DETECTED   Cannabinoid 50 Ng, Ur [place] NONE DETECTED NONE DETECTED   Barbiturates, Ur Screen NONE DETECTED NONE DETECTED   Benzodiazepine, Ur Scrn NONE DETECTED NONE DETECTED   Methadone Scn, Ur NONE DETECTED NONE DETECTED    Comment: (NOTE) Tricyclics + metabolites, urine    Cutoff 1000 ng/mL Amphetamines + metabolites, urine  Cutoff 1000 ng/mL MDMA (Ecstasy), urine              Cutoff 500 ng/mL Cocaine Metabolite, urine          Cutoff 300 ng/mL Opiate + metabolites, urine        Cutoff 300 ng/mL Phencyclidine (PCP), urine         Cutoff 25 ng/mL Cannabinoid, urine                 Cutoff 50 ng/mL Barbiturates + metabolites, urine  Cutoff 200 ng/mL Benzodiazepine, urine              Cutoff 200 ng/mL Methadone, urine                   Cutoff 300 ng/mL  The urine drug screen provides only a preliminary, unconfirmed analytical test result and should not be used for non-medical purposes. Clinical consideration and professional judgment should be applied to any positive drug screen result due to possible interfering substances. A more specific alternate chemical method must be used in order to obtain a confirmed analytical result. Gas chromatography / mass spectrometry (GC/MS) is the preferred confirm atory method. Performed at [facility], [address] [place], [place] [address]     Blood Alcohol level:  Lab Results  Component Value Date   [facility] <10 11/08/2019    Metabolic Disorder Labs:  No results found for: HGBA1C, MPG No results found for: PROLACTIN No results found for: CHOL, TRIG, HDL, CHOLHDL, VLDL, LDLCALC  Current Medications: Current  Facility-Administered Medications  Medication Dose Route Frequency Provider Last Rate Last Admin  . acetaminophen (TYLENOL) tablet 650 mg  650 mg Oral Q6H PRN Stony Stegmann T, MD      . alum & mag hydroxide-simeth (MAALOX/MYLANTA) 200-200-20 MG/5ML suspension 30 mL  30 mL Oral Q4H PRN Gedalia Mcmillon T, MD      . FLUoxetine (PROZAC) capsule 20 mg  20 mg Oral Daily Cherika Jessie T, MD   20 mg at 11/09/19 1515  . magnesium hydroxide (MILK OF MAGNESIA) suspension 30 mL  30 mL Oral Daily PRN Sarahgrace Broman T, MD      . pneumococcal 23 valent vaccine (PNEUMOVAX-23) injection 0.5 mL  0.5 mL Intramuscular Tomorrow-1000 Laetitia Schnepf T, MD      . traZODone (DESYREL) tablet 50 mg  50 mg Oral QHS PRN Gillermo Murdoch, NP       PTA Medications: No medications prior to admission.    Musculoskeletal: Strength & Muscle Tone: within normal limits Gait & Station: normal Patient leans: N/A  Psychiatric Specialty Exam: Physical Exam Vitals and nursing note reviewed.  Constitutional:      Appearance: He is well-developed.  HENT:     Head: Normocephalic and atraumatic.  Eyes:     Conjunctiva/sclera: Conjunctivae normal.     Pupils: Pupils are equal, round, and reactive to light.  Cardiovascular:     Heart sounds: Normal heart sounds.  Pulmonary:     Effort: Pulmonary effort is normal.  Abdominal:     Palpations: Abdomen is  soft.  Musculoskeletal:        General: Normal range of motion.     Cervical back: Normal range of motion.  Skin:    General: Skin is warm and dry.  Neurological:     General: No focal deficit present.     Mental Status: He is alert.  Psychiatric:        Mood and Affect: Mood normal.     Review of Systems  Constitutional: Negative.   HENT: Negative.   Eyes: Negative.   Respiratory: Negative.   Cardiovascular: Negative.   Gastrointestinal: Negative.   Musculoskeletal: Negative.   Skin: Negative.   Neurological: Negative.   Psychiatric/Behavioral: Positive for  dysphoric mood, sleep disturbance and suicidal ideas.    Blood pressure 129/85, pulse 88, temperature 98 F (36.7 C), temperature source Oral, resp. rate 18, height 5\' 11"  (1.803 m), weight 99.3 kg, SpO2 99 %.Body mass index is 30.54 kg/m.  General Appearance: Casual  Eye Contact:  Good  Speech:  Clear and Coherent  Volume:  Normal  Mood:  Dysphoric  Affect:  Congruent  Thought Process:  Coherent  Orientation:  Full (Time, Place, and Person)  Thought Content:  Logical  Suicidal Thoughts:  Yes.  without intent/plan  Homicidal Thoughts:  No  Memory:  Immediate;   Fair Recent;   Fair Remote;   Fair  Judgement:  Fair  Insight:  Fair  Psychomotor Activity:  Normal  Concentration:  Concentration: Fair  Recall:  of Knowledge:  Fair  Language:  Fair  Akathisia:  No  Handed:  Right  AIMS (if indicated):     Assets:  Desire for Improvement Housing Physical Health Social Support  ADL's:  Intact  Cognition:  WNL  Sleep:  Number of Hours: 5    Treatment Plan Summary: Daily contact with patient to assess and evaluate symptoms and progress in treatment, Medication management and Plan Patient with chronic depression with reasoning worsening of symptoms.  No psychosis.  Good insight appropriate cooperation with treatment.  Possibility of substance abuse is still open and he may be minimizing the degree to which this is a problem.  Patient was agreeable to the suggestion of starting fluoxetine 20 mg/day.  Patient will be engaged in individual and group therapy.  Continue 15-minute checks.  Reestablish contact tomorrow and reassess for possible discharge 1 to 2 days.  Observation Level/Precautions:  15 minute checks  Laboratory:  Chemistry Profile  Psychotherapy:    Medications:    Consultations:    Discharge Concerns:    Estimated LOS:  Other:     Physician Treatment Plan for Primary Diagnosis: Severe recurrent major depression without psychotic features (HCC) Long Term  Goal(s): Improvement in symptoms so as ready for discharge  Short Term Goals: Ability to verbalize feelings will improve, Ability to disclose and discuss suicidal ideas and Ability to demonstrate self-control will improve  Physician Treatment Plan for Secondary Diagnosis: Principal Problem:   Severe recurrent major depression without psychotic features (HCC)  Long Term Goal(s): Improvement in symptoms so as ready for discharge  Short Term Goals: Ability to maintain clinical measurements within normal limits will improve and Compliance with prescribed medications will improve  I certify that inpatient services furnished can reasonably be expected to improve the patient's condition.    [employer], MD 6/29/20213:33 PM

## 2019-11-09 NOTE — Tx Team (Addendum)
Interdisciplinary Treatment and Diagnostic Plan Update  11/09/2019 Time of Session: 9:00AM Nicholas Flynn MRN: [id number]  Principal Diagnosis: <principal problem not specified>  Secondary Diagnoses: Active Problems:   Severe recurrent major depression without psychotic features ([place])   Current Medications:  Current Facility-Administered Medications  Medication Dose Route Frequency Provider Last Rate Last Admin  . acetaminophen (TYLENOL) tablet 650 mg  650 mg Oral Q6H PRN Clapacs, John T, MD      . alum & mag hydroxide-simeth (MAALOX/MYLANTA) 200-200-20 MG/5ML suspension 30 mL  30 mL Oral Q4H PRN Clapacs, John T, MD      . magnesium hydroxide (MILK OF MAGNESIA) suspension 30 mL  30 mL Oral Daily PRN Clapacs, John T, MD      . pneumococcal 23 valent vaccine (PNEUMOVAX-23) injection 0.5 mL  0.5 mL Intramuscular Tomorrow-1000 Clapacs, John T, MD      . traZODone (DESYREL) tablet 50 mg  50 mg Oral QHS PRN Caroline Sauger, NP       PTA Medications: No medications prior to admission.    Patient Stressors: Substance abuse  Patient Strengths: Capable of independent living Curator fund of knowledge Motivation for treatment/growth  Treatment Modalities: Medication Management, Group therapy, Case management,  1 to 1 session with clinician, Psychoeducation, Recreational therapy.   Physician Treatment Plan for Primary Diagnosis: <principal problem not specified> Long Term Goal(s):     Short Term Goals:    Medication Management: Evaluate patient's response, side effects, and tolerance of medication regimen.  Therapeutic Interventions: 1 to 1 sessions, Unit Group sessions and Medication administration.  Evaluation of Outcomes: Not Met  Physician Treatment Plan for Secondary Diagnosis: Active Problems:   Severe recurrent major depression without psychotic features ([place])  Long Term Goal(s):     Short Term Goals:       Medication Management: Evaluate  patient's response, side effects, and tolerance of medication regimen.  Therapeutic Interventions: 1 to 1 sessions, Unit Group sessions and Medication administration.  Evaluation of Outcomes: Not Met   RN Treatment Plan for Primary Diagnosis: <principal problem not specified> Long Term Goal(s): Knowledge of disease and therapeutic regimen to maintain health will improve  Short Term Goals: Ability to demonstrate self-control, Ability to participate in decision making will improve, Ability to disclose and discuss suicidal ideas and Ability to identify and develop effective coping behaviors will improve  Medication Management: RN will administer medications as ordered by provider, will assess and evaluate patient's response and provide education to patient for prescribed medication. RN will report any adverse and/or side effects to prescribing provider.  Therapeutic Interventions: 1 on 1 counseling sessions, Psychoeducation, Medication administration, Evaluate responses to treatment, Monitor vital signs and CBGs as ordered, Perform/monitor CIWA, COWS, AIMS and Fall Risk screenings as ordered, Perform wound care treatments as ordered.  Evaluation of Outcomes: Not Met   LCSW Treatment Plan for Primary Diagnosis: <principal problem not specified> Long Term Goal(s): Safe transition to appropriate next level of care at discharge, Engage patient in therapeutic group addressing interpersonal concerns.  Short Term Goals: Engage patient in aftercare planning with referrals and resources, Increase social support, Increase ability to appropriately verbalize feelings, Increase emotional regulation, Facilitate acceptance of mental health diagnosis and concerns and Increase skills for wellness and recovery  Therapeutic Interventions: Assess for all discharge needs, 1 to 1 time with Social worker, Explore available resources and support systems, Assess for adequacy in community support network, Educate family  and significant other(s) on suicide prevention, Complete Psychosocial Assessment, Interpersonal group  therapy.  Evaluation of Outcomes: Not Met   Progress in Treatment: Attending groups: No. Participating in groups: No. Taking medication as prescribed: Yes. Toleration medication: Yes. Family/Significant other contact made: Yes, individual(s) contacted:  once permission is granted. Patient understands diagnosis: Yes. Discussing patient identified problems/goals with staff: Yes. Medical problems stabilized or resolved: Yes. Denies suicidal/homicidal ideation: Yes. Issues/concerns per patient self-inventory: No. Other: none  New problem(s) identified: No, Describe:  none  New Short Term/Long Term Goal(s): medication management for mood stabilization; elimination of SI thoughts; development of comprehensive mental wellness/sobriety plan.  Patient Goals:  "I came here to find someone to talk to and figure out my plan"  Discharge Plan or Barriers: CSW will assist pt in developing appropriate discharge plans.   Reason for Continuation of Hospitalization: Anxiety Depression Medication stabilization Suicidal ideation  Estimated Length of Stay: 1-7 days  Recreational Therapy: Patient: N/A Patient Goal: Patient will engage in groups without prompting or encouragement from LRT x3 group sessions within 5 recreation therapy group sessions.  Attendees: Patient: Nicholas Flynn 11/09/2019 9:16 AM  Physician: Dr. Weber Cooks, MD 11/09/2019 9:16 AM  Nursing: Collier Bullock, RN 11/09/2019 9:16 AM  RN Care Manager: 11/09/2019 9:16 AM  Social Worker: Assunta Curtis 11/09/2019 9:16 AM  Recreational Therapist: Roanna Epley, Reather Converse, LRT 11/09/2019 9:16 AM  Other: Sanjuana Kava, [place] 11/09/2019 9:16 AM  Other:  11/09/2019 9:16 AM  Other: 11/09/2019 9:16 AM    Scribe for Treatment Team: Rozann Lesches, LCSW 11/09/2019 9:16 AM

## 2019-11-09 NOTE — [facility] Suicide Risk Assessment (Signed)
[facility] Admission Suicide Risk Assessment   Nursing information obtained from:    Demographic factors:  Male Current Mental Status:  NA Loss Factors:  NA Historical Factors:  NA Risk Reduction Factors:  Sense of responsibility to family, Positive social support  Total Time spent with patient: 1 hour Principal Problem: Severe recurrent major depression without psychotic features (HCC) Diagnosis:  Principal Problem:   Severe recurrent major depression without psychotic features (HCC)  Subjective Data: Patient seen and chart reviewed.  Patient presented voluntarily with multiple symptoms of depression that have been chronic with worsening the last few months.  Suicidal thought without specific intent or plan.  No psychosis.  Drinking and using other drugs at times unclear how much that is contributing but did have cocaine in his drug screen.  Currently denies suicidal intent  Continued Clinical Symptoms:  Alcohol Use Disorder Identification Test Final Score (AUDIT): 8 The "Alcohol Use Disorders Identification Test", Guidelines for Use in Primary Care, Second Edition.  World Science writer [facility]). Score between 0-7:  no or low risk or alcohol related problems. Score between 8-15:  moderate risk of alcohol related problems. Score between 16-19:  high risk of alcohol related problems. Score 20 or above:  warrants further diagnostic evaluation for alcohol dependence and treatment.   CLINICAL FACTORS:   Depression:   Comorbid alcohol abuse/dependence   Musculoskeletal: Strength & Muscle Tone: within normal limits Gait & Station: normal Patient leans: N/A  Psychiatric Specialty Exam: Physical Exam Vitals and nursing note reviewed.  Constitutional:      Appearance: He is well-developed.  HENT:     Head: Normocephalic and atraumatic.  Eyes:     Conjunctiva/sclera: Conjunctivae normal.     Pupils: Pupils are equal, round, and reactive to light.  Cardiovascular:     Heart sounds:  Normal heart sounds.  Pulmonary:     Effort: Pulmonary effort is normal.  Abdominal:     Palpations: Abdomen is soft.  Musculoskeletal:        General: Normal range of motion.     Cervical back: Normal range of motion.  Skin:    General: Skin is warm and dry.  Neurological:     General: No focal deficit present.     Mental Status: He is alert.  Psychiatric:        Attention and Perception: Attention normal.        Mood and Affect: Mood is depressed.        Speech: Speech normal.        Behavior: Behavior normal.        Thought Content: Thought content normal.        Cognition and Memory: Cognition normal.        Judgment: Judgment normal.     Review of Systems  Constitutional: Negative.   HENT: Negative.   Eyes: Negative.   Respiratory: Negative.   Cardiovascular: Negative.   Gastrointestinal: Negative.   Musculoskeletal: Negative.   Skin: Negative.   Neurological: Negative.   Psychiatric/Behavioral: Positive for dysphoric mood, sleep disturbance and suicidal ideas.    Blood pressure 129/85, pulse 88, temperature 98 F (36.7 C), temperature source Oral, resp. rate 18, height 5\' 11"  (1.803 m), weight 99.3 kg, SpO2 99 %.Body mass index is 30.54 kg/m.  General Appearance: Casual  Eye Contact:  Good  Speech:  Clear and Coherent  Volume:  Normal  Mood:  Depressed  Affect:  Congruent  Thought Process:  Coherent  Orientation:  Full (Time, Place, and Person)  Thought Content:  Logical  Suicidal Thoughts:  No  Homicidal Thoughts:  No  Memory:  Immediate;   Fair Recent;   Fair Remote;   Fair  Judgement:  Fair  Insight:  Fair  Psychomotor Activity:  Normal  Concentration:  Concentration: Fair  Recall:  [employer] of Knowledge:  Fair  Language:  Fair  Akathisia:  No  Handed:  Right  AIMS (if indicated):     Assets:  Desire for Improvement Housing Physical Health Resilience Social Support  ADL's:  Intact  Cognition:  WNL  Sleep:  Number of Hours: 5       COGNITIVE FEATURES THAT CONTRIBUTE TO RISK:  None    SUICIDE RISK:   Minimal: No identifiable suicidal ideation.  Patients presenting with no risk factors but with morbid ruminations; may be classified as minimal risk based on the severity of the depressive symptoms  PLAN OF CARE: Continue 15-minute checks.  Full team assessment.  Discussed with patient the option of starting antidepressant medicine.  Reassess and consider possible discharge as early as tomorrow to outpatient follow-up  I certify that inpatient services furnished can reasonably be expected to improve the patient's condition.   Mordecai Rasmussen, MD 11/09/2019, 3:31 PM
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
## 2019-11-09 NOTE — [facility] Suicide Risk Assessment (Signed)
[facility] INPATIENT:  Family/Significant Other Suicide Prevention Education  Suicide Prevention Education:  Patient Refusal for Family/Significant Other Suicide Prevention Education: The patient Nicholas Flynn has refused to provide written consent for family/significant other to be provided Family/Significant Other Suicide Prevention Education during admission and/or prior to discharge.  Physician notified.  SPE completed with pt, as pt refused to consent to family contact. SPI pamphlet provided to pt and pt was encouraged to share information with support network, ask questions, and talk about any concerns relating to SPE. Pt denies access to guns/firearms and verbalized understanding of information provided. Mobile Crisis information also provided to pt.   Harden Mo 11/09/2019, 10:39 AM

## 2019-11-09 NOTE — Progress Notes (Signed)
Recreation Therapy Notes  INPATIENT RECREATION THERAPY ASSESSMENT  Patient Details Name: Nicholas Flynn MRN: [id number] DOB: 10/07/1990 Today's Date: 11/09/2019       Information Obtained From: Patient  Able to Participate in Assessment/Interview: Yes  Patient Presentation: Responsive  Reason for Admission (Per Patient): Active Symptoms, Suicidal Ideation  Patient Stressors:    Coping Skills:   Substance Abuse, Write, Exercise, Music  Leisure Interests (2+):  Individual - Reading, Social - Friends, Music - Listen, Exercise - Paediatric nurse, Exercise - Running, Exercise - Walking Adriana Simas)  Frequency of Recreation/Participation: Weekly  Awareness of Community Resources:  Yes  Community Resources:  Gym  Current Use:    If no, Barriers?:    Expressed Interest in [employer] Information:    [place] of Residence:  [place]  Patient Main Form of Transportation: Set designer  Patient Strengths:  helpful, kind, caring, listening, people person  Patient Identified Areas of Improvement:  Learn to open up more and drink less  Patient Goal for Hospitalization:  To figure out a plan and get some help  Current SI (including self-harm):  No  Current HI:  No  Current AVH: No  Staff Intervention Plan: Group Attendance, Collaborate with Interdisciplinary Treatment Team  Consent to Intern Participation: N/A  Mathea Frieling 11/09/2019, 1:09 PM

## 2019-11-09 NOTE — [facility] Group Notes (Signed)
Emotional Regulation 11/09/2019 1PM  Type of Therapy/Topic:  Group Therapy:  Emotion Regulation  Participation Level:  Active   Description of Group:   The purpose of this group is to assist patients in learning to regulate negative emotions and experience positive emotions. Patients will be guided to discuss ways in which they have been vulnerable to their negative emotions. These vulnerabilities will be juxtaposed with experiences of positive emotions or situations, and patients will be challenged to use positive emotions to combat negative ones. Special emphasis will be placed on coping with negative emotions in conflict situations, and patients will process healthy conflict resolution skills.  Therapeutic Goals: 1. Patient will identify two positive emotions or experiences to reflect on in order to balance out negative emotions 2. Patient will label two or more emotions that they find the most difficult to experience 3. Patient will demonstrate positive conflict resolution skills through discussion and/or role plays  Summary of Patient Progress: Pt actively and appropriately participated in group exercise. Pt identified "life changes" as a trigger that can lead him to developing irrational thinking. Pt interacted with group members and identified tips for dealing with triggers such as avoidance, having a list of trusted people he can talk to. Pt demonstrated good insight and respected boundaries during session.      Therapeutic Modalities:   Cognitive Behavioral Therapy Feelings Identification Dialectical Behavioral Therapy   Suzan Slick, LCSW 11/09/2019 2:13 PM

## 2019-11-09 NOTE — Progress Notes (Signed)
Recreation Therapy Notes   Date: 11/09/2019  Time: 9:30 am  Location: Craft room   Behavioral response: Appropriate  Intervention Topic: Relaxation   Discussion/Intervention:  Group content today was focused on relaxation. The group defined relaxation and identified healthy ways to relax. Individuals expressed how much time they spend relaxing. Patients expressed how much their life would be if they did not make time for themselves to relax. The group stated ways they could improve their relaxation techniques in the future.  Individuals participated in the intervention "Time to Relax" where they had a chance to experience different relaxation techniques.  Clinical Observations/Feedback:  Patient came to group but was pulled out by Child psychotherapist for assessment and never returned.  Nicholas Flynn LRT/CTRS         Kiarah Eckstein 11/09/2019 11:51 AM

## 2019-11-09 NOTE — Progress Notes (Signed)
D- Patient alert and oriented. Affect/mood is calm, cooperative and hopeful. Pt denies SI, HI, AVH. Pt feels "much better". Pt spoke with both his parents and it went well. Pt states that he has great conversation with people on the unit today.   A- Scheduled medications administered to patient, per MD orders. Support and encouragement provided.  Routine safety checks conducted every 15 minutes.  Patient informed to notify staff with problems or concerns.  R- No adverse drug reactions noted. Patient contracts for safety at this time. Patient compliant with medications and treatment plan. Patient receptive, calm, and cooperative. Patient interacts well with others on the unit.  Patient remains safe at this time.  Torrie Mayers RN

## 2019-11-09 NOTE — Plan of Care (Signed)
Pt rates depression 4/10 and hopelessness at 2/10. Pt denies anxiety, SI, HI and AVH. Pt was educated on care plan and verbalizes understanding. Pt was encouraged to attend groups. Torrie Mayers RN Problem: Education: Goal: Knowledge of [place] [facility] Education information/materials will improve Outcome: Progressing Goal: Emotional status will improve Outcome: Progressing Goal: Mental status will improve Outcome: Progressing Goal: Verbalization of understanding the information provided will improve Outcome: Progressing   Problem: Activity: Goal: Interest or engagement in activities will improve Outcome: Progressing Goal: Sleeping patterns will improve Outcome: Progressing   Problem: Coping: Goal: Ability to verbalize frustrations and anger appropriately will improve Outcome: Progressing Goal: Ability to demonstrate self-control will improve Outcome: Progressing

## 2019-11-10 MED ORDER — FLUOXETINE HCL 20 MG PO CAPS: 20.0000 mg | ORAL_CAPSULE | Freq: Every day | ORAL | 1 refills | Status: DC

## 2019-11-10 MED ORDER — FLUOXETINE HCL 20 MG PO CAPS: 20.0000 mg | ORAL_CAPSULE | Freq: Every day | ORAL | 0 refills | Status: DC

## 2019-11-10 MED ORDER — TRAZODONE HCL 50 MG PO TABS: 50.0000 mg | ORAL_TABLET | Freq: Every evening | ORAL | 1 refills | Status: AC | PRN

## 2019-11-10 NOTE — Progress Notes (Signed)
Patient is pleasant and easy to engage. He denies SI/HI/AVH anxiety and pain. He continues to endorse depression, but has good insight on his diagnosis and reason for hospitalization. He received his prescribed med and tolerated without incident. He is active on the unit and get along great with other peers on the unit. Patient is safe with 15 minute safety checks.  Patient informed to contact staff with any questions or concerns.  Cleo Butler-Nicholson, LPN

## 2019-11-10 NOTE — Discharge Summary (Signed)
Physician Discharge Summary Note  Patient:  Nicholas Flynn is an 29 y.o., male MRN:  [id number] DOB:  1990-08-12 Patient phone:  [phone] (home)  Patient address:   [address] [place] [address],  Total Time spent with patient: 30 minutes  Date of Admission:  11/08/2019 Date of Discharge: November 10, 2019  Reason for Admission: Patient was admitted through the emergency room where he presented voluntarily with complaints of depression and some suicidal thoughts  Principal Problem: Severe recurrent major depression without psychotic features [facility]) Discharge Diagnoses: Principal Problem:   Severe recurrent major depression without psychotic features [facility])   Past Psychiatric History: Describes a history of longstanding depression but no previous hospitalizations or treatment  Past Medical History: History reviewed. No pertinent past medical history. History reviewed. No pertinent surgical history. Family History: History reviewed. No pertinent family history. Family Psychiatric  History: Describes multiple family members with some degree of depression Social History:  Social History   Substance and Sexual Activity  Alcohol Use Yes     Social History   Substance and Sexual Activity  Drug Use Not on file    Social History   Socioeconomic History  . Marital status: Single    Spouse name: Not on file  . Number of children: Not on file  . Years of education: Not on file  . Highest education level: Not on file  Occupational History  . Not on file  Tobacco Use  . Smoking status: Never Smoker  . Smokeless tobacco: Never Used  Vaping Use  . Vaping Use: Every day  . Substances: Nicotine  Substance and Sexual Activity  . Alcohol use: Yes  . Drug use: Not on file  . Sexual activity: Not on file  Other Topics Concern  . Not on file  Social History Narrative  . Not on file   Social Determinants of Health   Financial Resource Strain:   . Difficulty of  Paying Living Expenses:   Food Insecurity:   . Worried About Programme researcher, broadcasting/film/video in the Last Year:   . Barista in the Last Year:   Transportation Needs:   . Freight forwarder (Medical):   Marland Kitchen Lack of Transportation (Non-Medical):   Physical Activity:   . Days of Exercise per Week:   . Minutes of Exercise per Session:   Stress:   . Feeling of Stress :   Social Connections:   . Frequency of Communication with Friends and Family:   . Frequency of Social Gatherings with Friends and Family:   . Attends Religious Services:   . Active Member of Clubs or Organizations:   . Attends Banker Meetings:   Marland Kitchen Marital Status:     Hospital Course: Admitted to psychiatric ward.  15-minute checks maintained.  Patient showed no dangerous behavior in the hospital.  He was cooperative and engaged in treatment.  He agreed to a trial of starting fluoxetine for depression.  He engaged appropriately in groups on the ward.  Patient has been educated about appropriate treatment for depression.  At the time of discharge she is denying any suicidal ideation and expresses optimism.  Prescriptions and 7-day supply provided.  Patient was also counseled that if he has veterans benefits the [facility] could possibly be his very best option for outpatient mental health treatment.  Physical Findings: AIMS: Facial and Oral Movements Muscles of Facial Expression: None, normal Lips and Perioral Area: None, normal Jaw: None, normal Tongue: None, normal,Extremity Movements  Upper (arms, wrists, hands, fingers): None, normal Lower (legs, knees, ankles, toes): None, normal, Trunk Movements Neck, shoulders, hips: None, normal, Overall Severity Severity of abnormal movements (highest score from questions above): None, normal Incapacitation due to abnormal movements: None, normal Patient's awareness of abnormal movements (rate only patient's report): No Awareness, Dental Status Current problems with teeth and/or  dentures?: No Does patient usually wear dentures?: No  CIWA:  CIWA-Ar Total: 1 COWS:     Musculoskeletal: Strength & Muscle Tone: within normal limits Gait & Station: normal Patient leans: N/A  Psychiatric Specialty Exam: Physical Exam Vitals and nursing note reviewed.  Constitutional:      Appearance: He is well-developed.  HENT:     Head: Normocephalic and atraumatic.  Eyes:     Conjunctiva/sclera: Conjunctivae normal.     Pupils: Pupils are equal, round, and reactive to light.  Cardiovascular:     Heart sounds: Normal heart sounds.  Pulmonary:     Effort: Pulmonary effort is normal.  Abdominal:     Palpations: Abdomen is soft.  Musculoskeletal:        General: Normal range of motion.     Cervical back: Normal range of motion.  Skin:    General: Skin is warm and dry.  Neurological:     General: No focal deficit present.     Mental Status: He is alert.  Psychiatric:        Mood and Affect: Mood normal.     Review of Systems  Constitutional: Negative.   HENT: Negative.   Eyes: Negative.   Respiratory: Negative.   Cardiovascular: Negative.   Gastrointestinal: Negative.   Musculoskeletal: Negative.   Skin: Negative.   Neurological: Negative.   Psychiatric/Behavioral: Negative.     Blood pressure 124/85, pulse 85, temperature 97.9 F (36.6 C), temperature source Oral, resp. rate 18, height 5\' 11"  (1.803 m), weight 99.3 kg, SpO2 98 %.Body mass index is 30.54 kg/m.  General Appearance: Casual  Eye Contact:  Good  Speech:  Clear and Coherent  Volume:  Normal  Mood:  Euthymic  Affect:  Congruent  Thought Process:  Goal Directed  Orientation:  Full (Time, Place, and Person)  Thought Content:  Logical  Suicidal Thoughts:  No  Homicidal Thoughts:  No  Memory:  Immediate;   Fair Recent;   Fair Remote;   Fair  Judgement:  Fair  Insight:  Fair  Psychomotor Activity:  Normal  Concentration:  Concentration: Fair  Recall:  of Knowledge:  Fair   Language:  Fair  Akathisia:  No  Handed:  Right  AIMS (if indicated):     Assets:  Desire for Improvement  ADL's:  Intact  Cognition:  WNL  Sleep:  Number of Hours: 8     Have you used any form of tobacco in the last 30 days? (Cigarettes, Smokeless Tobacco, Cigars, and/or Pipes): Yes  Has this patient used any form of tobacco in the last 30 days? (Cigarettes, Smokeless Tobacco, Cigars, and/or Pipes) Yes, No  Blood Alcohol level:  Lab Results  Component Value Date   ETH <10 11/08/2019    Metabolic Disorder Labs:  No results found for: HGBA1C, MPG No results found for: PROLACTIN No results found for: CHOL, TRIG, HDL, CHOLHDL, VLDL, LDLCALC  See Psychiatric Specialty Exam and Suicide Risk Assessment completed by Attending Physician prior to discharge.  Discharge destination:  Home  Is patient on multiple antipsychotic therapies at discharge:  No   Has Patient had three or more  failed trials of antipsychotic monotherapy by history:  No  Recommended Plan for Multiple Antipsychotic Therapies: NA  Discharge Instructions    Diet - low sodium heart healthy   Complete by: As directed    Increase activity slowly   Complete by: As directed      Allergies as of 11/10/2019   No Known Allergies     Medication List    TAKE these medications     Indication  FLUoxetine 20 MG capsule Commonly known as: PROZAC Take 1 capsule (20 mg total) by mouth daily. Start taking on: November 11, 2019  Indication: Depression   traZODone 50 MG tablet Commonly known as: DESYREL Take 1 tablet (50 mg total) by mouth at bedtime as needed for sleep.  Indication: Trouble Sleeping        Follow-up recommendations:  Activity:  Activity as tolerated Diet:  Regular diet Other:  Follow-up with outpatient treatment as recommended  Comments: 7-day supply and prescription provided at discharge  Signed: Mordecai Rasmussen, MD 11/10/2019, 11:39 AM

## 2019-11-10 NOTE — Progress Notes (Signed)
Recreation Therapy Notes  INPATIENT RECREATION TR PLAN  Patient Details Name: Jai Steil MRN: [id number] DOB: 1990/07/03 Today's Date: 11/10/2019  Rec Therapy Plan Is patient appropriate for Therapeutic Recreation?: Yes Treatment times per week: at least 3 Estimated Length of Stay: 5-7 days TR Treatment/Interventions: Group participation (Comment)  Discharge Criteria Pt will be discharged from therapy if:: Discharged Treatment plan/goals/alternatives discussed and agreed upon by:: Patient/family  Discharge Summary Short term goals set: Patient will identify 3 positive coping skills strategies to use post d/c within 5 recreation therapy group sessions Short term goals met: Adequate for discharge Progress toward goals comments: Groups attended Which groups?: Communication, Other (Comment) (Relaxation) Reason goals not met: N/A Therapeutic equipment acquired: N/A Reason patient discharged from therapy: Discharge from hospital Pt/family agrees with progress & goals achieved: Yes Date patient discharged from therapy: 11/10/19   Taunia Frasco 11/10/2019, 12:26 PM

## 2019-11-10 NOTE — Progress Notes (Signed)
  [facility] Adult Case Management Discharge Plan :  Will you be returning to the same living situation after discharge:  Yes,  pt reports he is returning home. At discharge, do you have transportation home?: Yes,  pt reports that his mother will provide transportation.  Do you have the ability to pay for your medications: No.  Release of information consent forms completed and in the chart;  Patient's signature needed at discharge.  Patient to Follow up at:  Follow-up Information    [facility] Follow up.   Specialty: Professional Counselor Why: Beaulah Corin from 8:30AM to 12:00PM and 1:00PM to 2:30PM. Thanks! Contact information: [facility] [address] [place] [address] [phone]               Next level of care provider has access to [facility] Link:no  Safety Planning and Suicide Prevention discussed: No. SPE completed with the patient.   Have you used any form of tobacco in the last 30 days? (Cigarettes, Smokeless Tobacco, Cigars, and/or Pipes): Yes  Has patient been referred to the Quitline?: Patient refused referral  Patient has been referred for addiction treatment: Pt. refused referral  Harden Mo, LCSW 11/10/2019, 12:34 PM

## 2019-11-10 NOTE — [facility] Suicide Risk Assessment (Signed)
[facility] Discharge Suicide Risk Assessment   Principal Problem: Severe recurrent major depression without psychotic features [facility]) Discharge Diagnoses: Principal Problem:   Severe recurrent major depression without psychotic features (HCC)   Total Time spent with patient: 30 minutes  Musculoskeletal: Strength & Muscle Tone: within normal limits Gait & Station: normal Patient leans: N/A  Psychiatric Specialty Exam: Review of Systems  Constitutional: Negative.   HENT: Negative.   Eyes: Negative.   Respiratory: Negative.   Cardiovascular: Negative.   Gastrointestinal: Negative.   Musculoskeletal: Negative.   Skin: Negative.   Neurological: Negative.   Psychiatric/Behavioral: Negative.     Blood pressure 124/85, pulse 85, temperature 97.9 F (36.6 C), temperature source Oral, resp. rate 18, height 5\' 11"  (1.803 m), weight 99.3 kg, SpO2 98 %.Body mass index is 30.54 kg/m.  General Appearance: Casual  Eye Contact::  Good  Speech:  Normal Rate409  Volume:  Normal  Mood:  Euthymic  Affect:  Congruent  Thought Process:  Coherent  Orientation:  Full (Time, Place, and Person)  Thought Content:  Logical  Suicidal Thoughts:  No  Homicidal Thoughts:  No  Memory:  Immediate;   Fair Recent;   Fair Remote;   Fair  Judgement:  Fair  Insight:  Fair  Psychomotor Activity:  Normal  Concentration:  Fair  Recall:  [phone] of Knowledge:Fair  Language: Fair  Akathisia:  No  Handed:  Right  AIMS (if indicated):     Assets:  Desire for Improvement Housing Physical Health Resilience Social Support  Sleep:  Number of Hours: 8  Cognition: WNL  ADL's:  Intact   Mental Status Per Nursing Assessment::   On Admission:  NA  Demographic Factors:  Male  Loss Factors: NA  Historical Factors: NA  Risk Reduction Factors:   Sense of responsibility to family, Employed, Positive social support and Positive coping skills or problem solving skills  Continued Clinical Symptoms:   Depression:   Anhedonia  Cognitive Features That Contribute To Risk:  None    Suicide Risk:  Minimal: No identifiable suicidal ideation.  Patients presenting with no risk factors but with morbid ruminations; may be classified as minimal risk based on the severity of the depressive symptoms    Plan Of Care/Follow-up recommendations:  Activity:  as tolerated Diet:  regular Other:  follow up with outpt treatment  [phone], MD 11/10/2019, 11:30 AM

## 2019-11-10 NOTE — Progress Notes (Signed)
Recreation Therapy Notes   Date: 11/10/2019  Time: 9:30 am  Location: Craft room   Behavioral response: Appropriate  Intervention Topic: Communication    Discussion/Intervention:  Group content today was focused on communication. The group defined communication and ways to communicate with others. Individuals stated reason why communication is important and some reasons to communicate with others. Patients expressed if they thought they were good at communicating with others and ways they could improve their communication skills. The group identified important parts of communication and some experiences they have had in the past with communication. The group participated in the intervention "Scattergories", where they had a chance to test out their communication skills and identify ways to improve their communication techniques.  Clinical Observations/Feedback:  Patient came to group and defined communication as actively listening and providing feed back. He stated that eye contact is an important component of communication for him. Individual was social with peers and staff while participating in the intervention.  Mesha Schamberger LRT/CTRS          Younes Degeorge 11/10/2019 12:07 PM

## 2019-11-10 NOTE — Plan of Care (Signed)
Pt rates depression 2/10. Pt denies anxiety, SI, HI and AVH.  Pt denies SI, HI and AVH. Pt was educated on care plan and verbalizes understanding. Pt was encouraged to attend groups. Torrie Mayers RN Problem: Education: Goal: Knowledge of [place] Education information/materials will improve Outcome: Adequate for Discharge Goal: Emotional status will improve Outcome: Adequate for Discharge Goal: Mental status will improve Outcome: Adequate for Discharge Goal: Verbalization of understanding the information provided will improve Outcome: Adequate for Discharge   Problem: Activity: Goal: Interest or engagement in activities will improve Outcome: Adequate for Discharge Goal: Sleeping patterns will improve Outcome: Adequate for Discharge   Problem: Coping: Goal: Ability to verbalize frustrations and anger appropriately will improve Outcome: Adequate for Discharge Goal: Ability to demonstrate self-control will improve Outcome: Adequate for Discharge   Problem: Coping: Goal: Ability to verbalize frustrations and anger appropriately will improve Outcome: Adequate for Discharge Goal: Ability to demonstrate self-control will improve Outcome: Adequate for Discharge   Problem: Health Behavior/Discharge Planning: Goal: Identification of resources available to assist in meeting health care needs will improve Outcome: Adequate for Discharge Goal: Compliance with treatment plan for underlying cause of condition will improve Outcome: Adequate for Discharge   Problem: Physical Regulation: Goal: Ability to maintain clinical measurements within normal limits will improve Outcome: Adequate for Discharge   Problem: Safety: Goal: Periods of time without injury will increase Outcome: Adequate for Discharge   Problem: Education: Goal: Knowledge of disease or condition will improve Outcome: Adequate for Discharge Goal: Understanding of discharge needs will improve Outcome: Adequate  for Discharge   Problem: Education: Goal: Utilization of techniques to improve thought processes will improve Outcome: Adequate for Discharge   Problem: Coping: Goal: Will verbalize feelings Outcome: Adequate for Discharge   Problem: Health Behavior/Discharge Planning: Goal: Compliance with therapeutic regimen will improve Outcome: Adequate for Discharge   Problem: Role Relationship: Goal: Will demonstrate positive changes in social behaviors and relationships Outcome: Adequate for Discharge

## 2019-11-10 NOTE — Plan of Care (Signed)
  Problem: Coping Skills Goal: STG - Patient will identify 3 positive coping skills strategies to use post d/c within 5 recreation therapy group sessions Description: STG - Patient will identify 3 positive coping skills strategies to use post d/c within 5 recreation therapy group sessions 11/10/2019 1225 by Alveria Apley, LRT Outcome: Adequate for Discharge 11/10/2019 1225 by Alveria Apley, LRT Outcome: Adequate for Discharge

## 2019-11-10 NOTE — Progress Notes (Signed)
Pt denies SI, HI and AVH. Pt was educated on dc plan and verbalizes understanding. Pt received prescriptions, medications, dc packet and belongings. Torrie Mayers RN

## 2020-02-13 IMAGING — CR DG CHEST 2V
1 series · 2 of 2 positions shown · non-contrast
Comparison: November 30, 2008

CLINICAL DATA: Fatigue

EXAM:
CHEST - 2 VIEW

[Series 1: dg chest 2 view · 0.14mm/px · 2 of 2 slices shown]
[im 1/2]
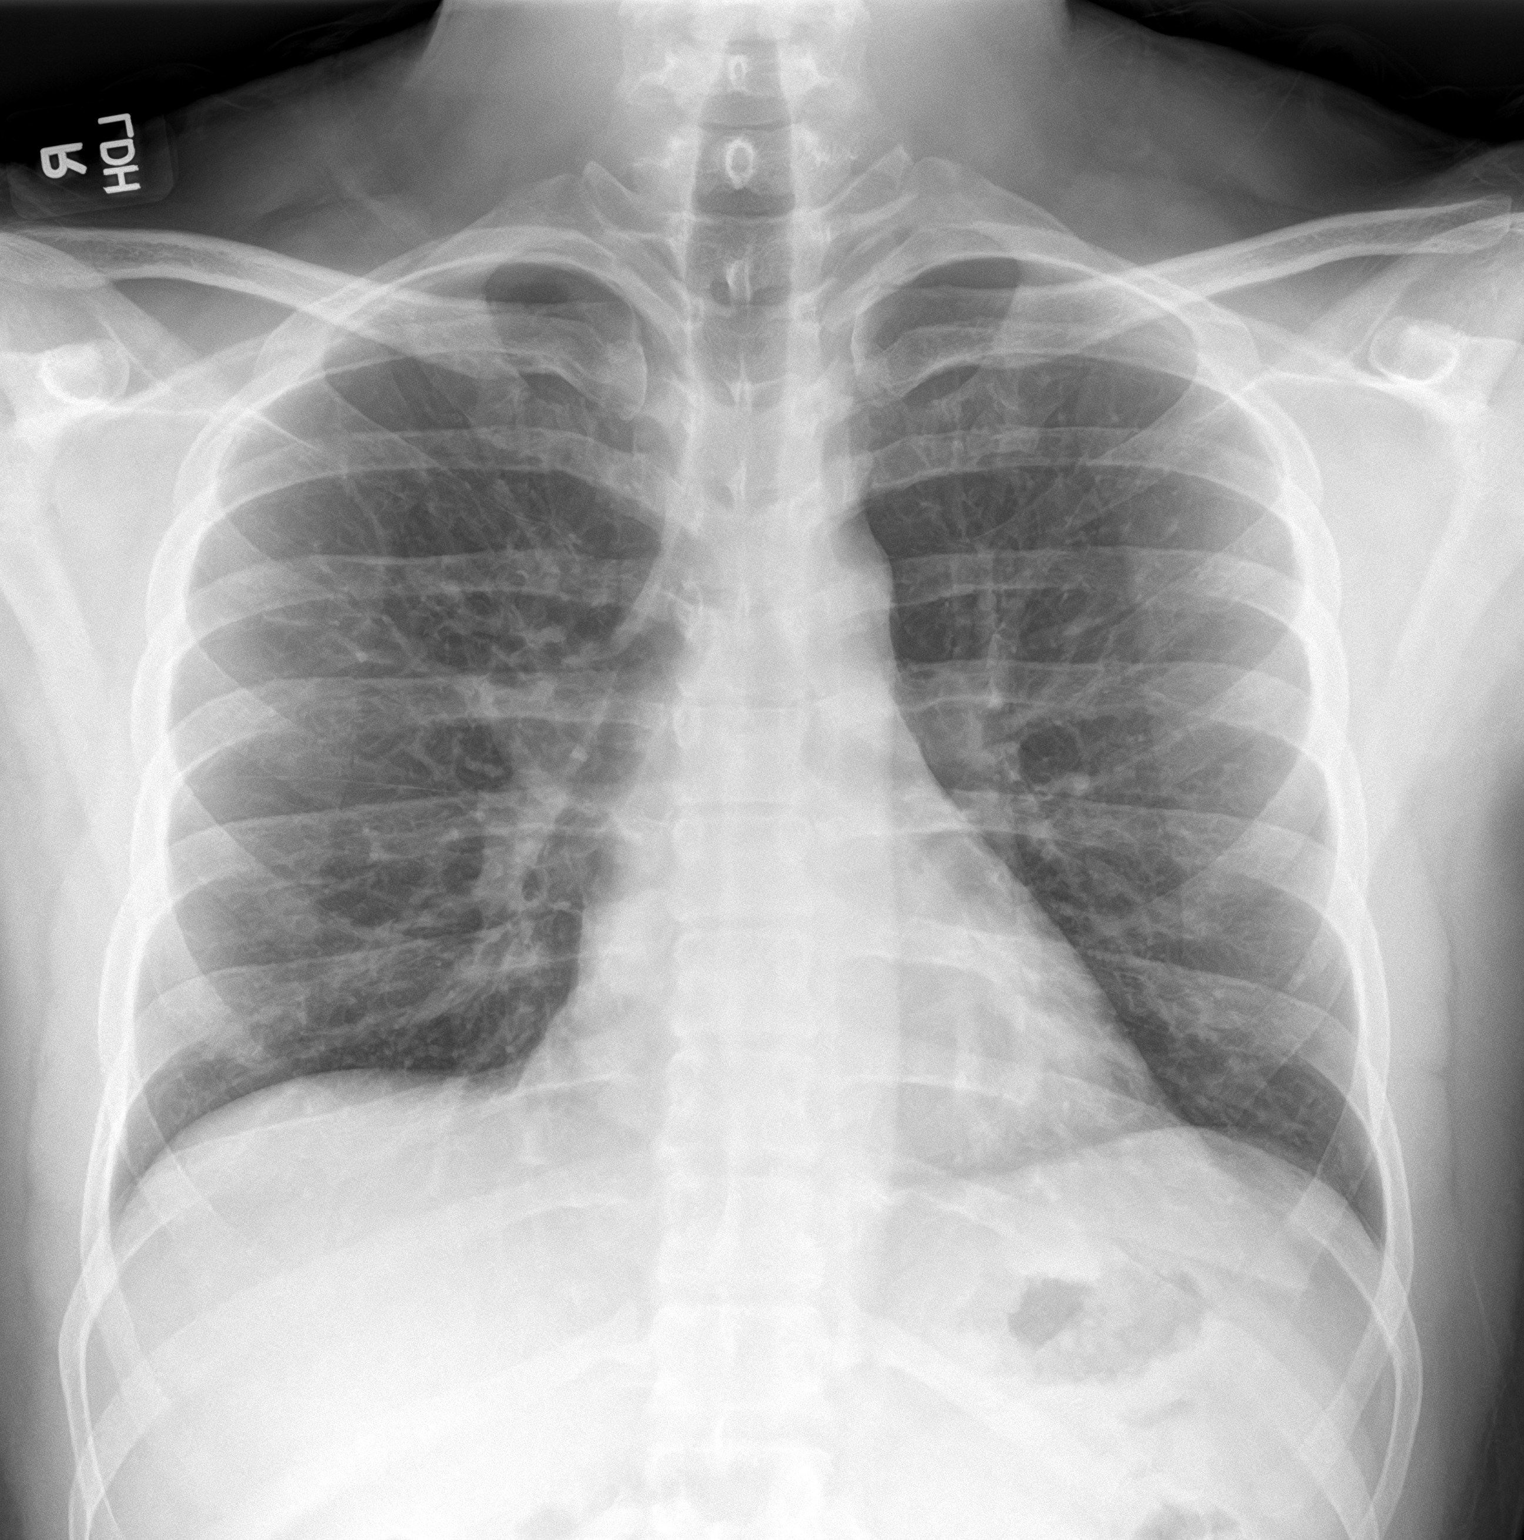
[im 2/2]
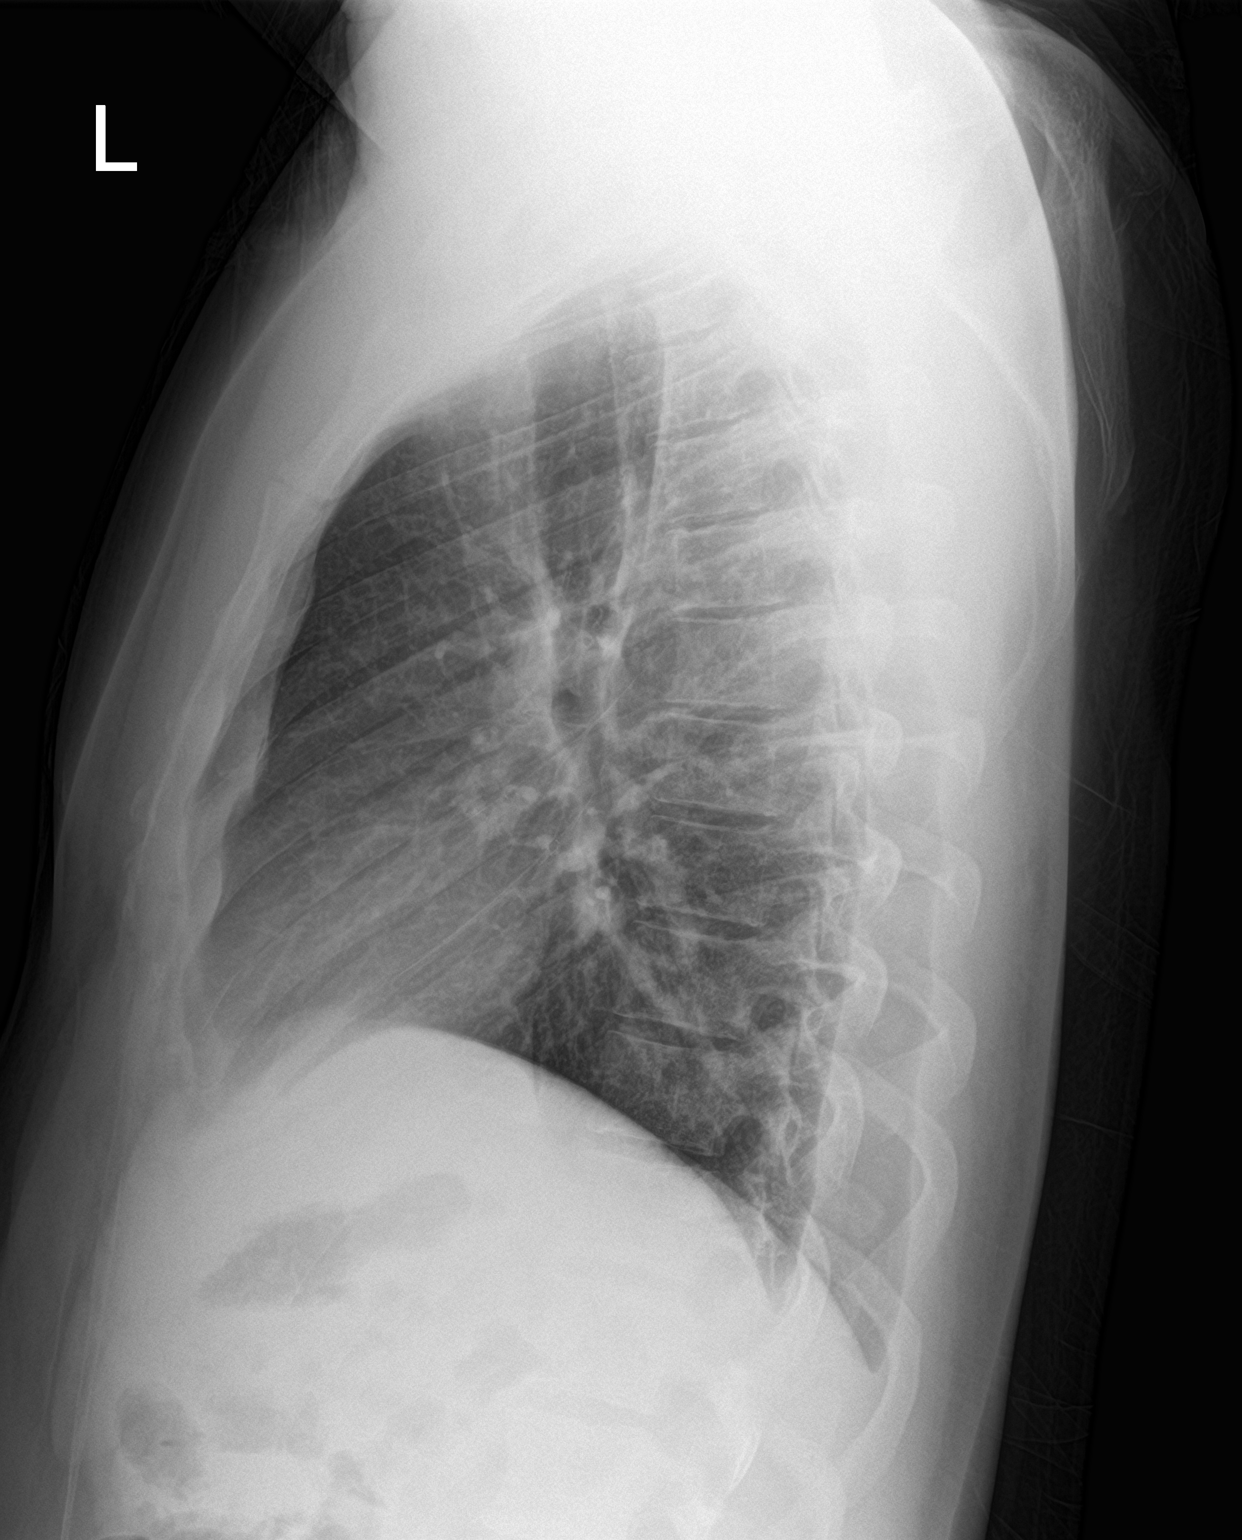

[2 of 2 positions shown; findings below may reference images not displayed]

FINDINGS: Lungs are clear. The heart size and pulmonary vascularity are
normal. No adenopathy. No bone lesions.
IMPRESSION: No edema or consolidation.  No evident adenopathy.

## 2020-09-18 ENCOUNTER — Emergency Department
Admission: EM | Admit: 2020-09-18 | Discharge: 2020-09-18 | Disposition: A | Discharge status: Home / Self Care | Payer: No Typology Code available for payment source | Attending: Emergency Medicine | Admitting: Emergency Medicine

## 2020-09-18 ENCOUNTER — Emergency Department: Payer: No Typology Code available for payment source

## 2020-09-18 ENCOUNTER — Other Ambulatory Visit: Payer: Self-pay

## 2020-09-18 DIAGNOSIS — I10 Essential (primary) hypertension: Secondary | ICD-10-CM | POA: Diagnosis not present

## 2020-09-18 DIAGNOSIS — Z8616 Personal history of COVID-19: Secondary | ICD-10-CM | POA: Diagnosis not present

## 2020-09-18 DIAGNOSIS — M542 Cervicalgia: Secondary | ICD-10-CM | POA: Insufficient documentation

## 2020-09-18 DIAGNOSIS — R059 Cough, unspecified: Secondary | ICD-10-CM | POA: Diagnosis not present

## 2020-09-18 DIAGNOSIS — R0602 Shortness of breath: Secondary | ICD-10-CM | POA: Insufficient documentation

## 2020-09-18 DIAGNOSIS — E876 Hypokalemia: Secondary | ICD-10-CM | POA: Diagnosis not present

## 2020-09-18 DIAGNOSIS — R197 Diarrhea, unspecified: Secondary | ICD-10-CM | POA: Insufficient documentation

## 2020-09-18 DIAGNOSIS — Z20822 Contact with and (suspected) exposure to covid-19: Secondary | ICD-10-CM | POA: Insufficient documentation

## 2020-09-18 DIAGNOSIS — R5383 Other fatigue: Secondary | ICD-10-CM

## 2020-09-18 LAB — CBC
HCT: 40.7 % (ref 39.0–52.0)
Hemoglobin: 13.3 g/dL (ref 13.0–17.0)
MCH: 26.5 pg (ref 26.0–34.0)
MCHC: 32.7 g/dL (ref 30.0–36.0)
MCV: 81.2 fL (ref 80.0–100.0)
Platelets: 224 10*3/uL (ref 150–400)
RBC: 5.01 MIL/uL (ref 4.22–5.81)
RDW: 14.3 % (ref 11.5–15.5)
WBC: 6.5 10*3/uL (ref 4.0–10.5)
nRBC: 0 % (ref 0.0–0.2)

## 2020-09-18 LAB — HEPATIC FUNCTION PANEL
ALT: 22 U/L (ref 0–44)
AST: 21 U/L (ref 15–41)
Albumin: 3.7 g/dL (ref 3.5–5.0)
Alkaline Phosphatase: 65 U/L (ref 38–126)
Bilirubin, Direct: <0.1 mg/dL (ref 0.0–0.2)
Total Bilirubin: 0.7 mg/dL (ref 0.3–1.2)
Total Protein: 6.8 g/dL (ref 6.5–8.1)

## 2020-09-18 LAB — LIPASE, BLOOD: Lipase: 30 U/L (ref 11–51)

## 2020-09-18 LAB — BASIC METABOLIC PANEL
Anion gap: 8 (ref 5–15)
BUN: 12 mg/dL (ref 6–20)
CO2: 24 mmol/L (ref 22–32)
Calcium: 8.8 mg/dL — ABNORMAL LOW (ref 8.9–10.3)
Chloride: 104 mmol/L (ref 98–111)
Creatinine, Ser: 0.99 mg/dL (ref 0.61–1.24)
GFR, Estimated: >60 mL/min (ref >60)
Glucose, Bld: 99 mg/dL (ref 70–99)
Potassium: 3.2 mmol/L — ABNORMAL LOW (ref 3.5–5.1)
Sodium: 136 mmol/L (ref 135–145)

## 2020-09-18 LAB — TROPONIN I (HIGH SENSITIVITY): Troponin I (High Sensitivity): 3 ng/L (ref <18)

## 2020-09-18 LAB — RESP PANEL BY RT-PCR (FLU A&B, COVID) ARPGX2
Influenza A by PCR: NEGATIVE
Influenza B by PCR: NEGATIVE
SARS Coronavirus 2 by RT PCR: NEGATIVE

## 2020-09-18 LAB — MAGNESIUM: Magnesium: 1.9 mg/dL (ref 1.7–2.4)

## 2020-09-18 LAB — PROCALCITONIN: Procalcitonin: <0.1 ng/mL

## 2020-09-18 MED ORDER — POTASSIUM CHLORIDE CRYS ER 20 MEQ PO TBCR
80.0000 meq | EXTENDED_RELEASE_TABLET | Freq: Once | ORAL | Status: AC
  Administered 2020-09-18: 80 meq via ORAL
  Filled 2020-09-18: qty 4

## 2020-09-18 NOTE — ED Triage Notes (Signed)
Pt comes with c/o SOB for few weeks, dizziness and some diarrhea. Pt states he had COVID3x and is vaccinated but has still been having issues since.  Pt states he has been feeling tired as well. Pt denies any CP

## 2020-09-18 NOTE — Discharge Instructions (Addendum)
Your blood pressure was slightly elevated today.  Please have this rechecked when you establish care with a primary care doctor.

## 2020-09-18 NOTE — ED Provider Notes (Signed)
[facility] Emergency Department Provider Note  ____________________________________________   Event Date/Time   First MD Initiated Contact with Patient 09/18/20 1119     (approximate)  I have reviewed the triage vital signs and the nursing notes.   HISTORY  Chief Complaint Shortness of Breath   HPI Nicholas Flynn is a 30 y.o. male with past medical history of depression and 3 prior COVID infections most recently in January of this year who presents for assessment of chronic but progressive cough, shortness of breath, adenopathy on anterior neck, fatigue, diarrhea and malaise.  Also states he has been feeling dizzy over the last couple weeks intermittently.  No specific earache, vision changes, chest pain, hemoptysis, acute back pain, urinary symptoms, vomiting, rash or recent trauma.  Endorses using a vape but does not smoke cigarettes or use illicit drugs or drink alcohol regularly.  No other acute concerns at this time.         History reviewed. No pertinent past medical history.  Patient Active Problem List   Diagnosis Date Noted  . Severe recurrent major depression without psychotic features (HCC) 11/08/2019    History reviewed. No pertinent surgical history.  Prior to Admission medications   Medication Sig Start Date End Date Taking? Authorizing Provider  FLUoxetine (PROZAC) 20 MG capsule Take 1 capsule (20 mg total) by mouth daily. 11/11/19   Clapacs, Jackquline Denmark, MD  traZODone (DESYREL) 50 MG tablet Take 1 tablet (50 mg total) by mouth at bedtime as needed for sleep. 11/10/19   Clapacs, Jackquline Denmark, MD    Allergies Patient has no known allergies.  No family history on file.  Social History Social History   Tobacco Use  . Smoking status: Never Smoker  . Smokeless tobacco: Never Used  Vaping Use  . Vaping Use: Every day  . Substances: Nicotine  Substance Use Topics  . Alcohol use: Yes    Review of Systems  Review of Systems   Constitutional: Positive for malaise/fatigue. Negative for chills and fever.  HENT: Negative for sore throat.   Eyes: Negative for pain.  Respiratory: Positive for cough and shortness of breath. Negative for stridor.   Cardiovascular: Negative for chest pain.  Gastrointestinal: Positive for diarrhea. Negative for vomiting.  Genitourinary: Negative for dysuria.  Musculoskeletal: Positive for myalgias.  Skin: Negative for rash.  Neurological: Positive for dizziness. Negative for seizures, loss of consciousness and headaches.  Psychiatric/Behavioral: Negative for suicidal ideas.  All other systems reviewed and are negative.     ____________________________________________   PHYSICAL EXAM:  VITAL SIGNS: ED Triage Vitals [09/18/20 1119]  Enc Vitals Group     BP      Pulse      Resp      Temp      Temp src      SpO2      Weight      Height      Head Circumference      Peak Flow      Pain Score 6     Pain Loc      Pain Edu?      Excl. in GC?    Vitals:   09/18/20 1220 09/18/20 1230  BP:    Pulse: 63 63  Resp: 13 13  Temp:    SpO2: 100% 99%   Physical Exam Vitals and nursing note reviewed.  Constitutional:      Appearance: He is well-developed.  HENT:     Head: Normocephalic and atraumatic.  Right Ear: External ear normal.     Left Ear: External ear normal.     Nose: Nose normal.  Eyes:     Conjunctiva/sclera: Conjunctivae normal.  Cardiovascular:     Rate and Rhythm: Normal rate and regular rhythm.     Heart sounds: No murmur heard.   Pulmonary:     Effort: Pulmonary effort is normal. No respiratory distress.     Breath sounds: Normal breath sounds.  Abdominal:     Palpations: Abdomen is soft.     Tenderness: There is no abdominal tenderness.  Musculoskeletal:     Cervical back: Neck supple.     Right lower leg: No edema.     Left lower leg: No edema.  Skin:    General: Skin is warm and dry.     Capillary Refill: Capillary refill takes less than  2 seconds.  Neurological:     Mental Status: He is alert and oriented to person, place, and time.  Psychiatric:        Mood and Affect: Mood normal.     Cranial nerves II through XII grossly intact.  No pronator drift.  No finger dysmetria.  Symmetric 5/5 strength of all extremities.  Sensation intact to light touch in all extremities.  Unremarkable unassisted gait.  ____________________________________________   LABS (all labs ordered are listed, but only abnormal results are displayed)  Labs Reviewed  BASIC METABOLIC PANEL - Abnormal; Notable for the following components:      Result Value   Potassium 3.2 (*)    Calcium 8.8 (*)    All other components within normal limits  RESP PANEL BY RT-PCR (FLU A&B, COVID) ARPGX2  CBC  MAGNESIUM  HEPATIC FUNCTION PANEL  LIPASE, BLOOD  PROCALCITONIN  TROPONIN I (HIGH SENSITIVITY)  TROPONIN I (HIGH SENSITIVITY)   ____________________________________________  EKG  Sinus rhythm with ventricular rate of 74, normal axis, unremarkable intervals no clearance of acute ischemia or other significant underlying arrhythmia. ____________________________________________  RADIOLOGY  ED MD interpretation: No full consolidation, effusion, overt edema, pneumothorax or other clear acute intrathoracic process.  Official radiology report(s): DG Chest 2 View  Result Date: 09/18/2020 CLINICAL DATA:  30 year old male with shortness of breath, dizziness, diarrhea. EXAM: CHEST - 2 VIEW COMPARISON:  Chest radiographs 05/24/2019 and earlier. FINDINGS: Mildly lower lung volumes today. Mediastinal contours remain normal. Visualized tracheal air column is within normal limits. Both lungs appear clear. No pneumothorax or pleural effusion. No osseous abnormality identified. Negative visible bowel gas pattern. IMPRESSION: Negative. No cardiopulmonary abnormality. Electronically Signed   By: Odessa Fleming M.D.   On: 09/18/2020 11:58     ____________________________________________   PROCEDURES  Procedure(s) performed (including Critical Care):  .1-3 Lead EKG Interpretation Performed by: Gilles Chiquito, MD Authorized by: Gilles Chiquito, MD     Interpretation: normal     ECG rate assessment: normal     Rhythm: sinus rhythm     Ectopy: none     Conduction: normal       ____________________________________________   INITIAL IMPRESSION / ASSESSMENT AND PLAN / ED COURSE      Patient presents with above-stated history exam for assessment of chronic but progressive shortness of breath cough and neck symptoms of his neck diarrhea fatigue and intermittent dizziness for the last several months since he last had COVID in January.  On arrival he is hypertensive with BP of 161/108 with otherwise stable vital signs on room air.  His lungs are clear bilaterally and he has  a nonfocal neuro exam.  Abdomen is soft nontender.  Differential includes arrhythmia, PE, pneumonia, repeat COVID or flu infection, metabolic derangements, possible long COVID symptoms.  Low suspicion for PE as patient is PERC negative.  No findings on exam to suggest CVA.  No recent trauma.  Suspicion for toxic ingestion.  No evidence on history or exam of deep space infection in the head or neck and he does not appear septic or meningitic.  ECG and nonelevated troponin not suggestive of ACS or myocarditis.  No significant arrhythmia identified today.  BMP remarkable for K of 3.2 which was repleted without any other significant derangements.  CBC shows no leukocytosis or acute anemia.  COVID and flu is negative.  Magnesium hepatic function panel and lipase unremarkable not consistent with pancreatitis cholestasis hepatitis or other acute metabolic derangement.  Overall unclear allergy for patient's symptoms although given stable vitals otherwise reassuring exam and duration of symptoms greater than 1 month I think is safe for discharge with plan for  continued outpatient evaluation.  Counseled him on avoiding any vaping or tobacco use in the future and he was provided instructions in writing to have his blood pressure rechecked when he sees her PCP as it was noted to be elevated today.        ____________________________________________   FINAL CLINICAL IMPRESSION(S) / ED DIAGNOSES  Final diagnoses:  Cough  SOB (shortness of breath)  Fatigue, unspecified type  Diarrhea, unspecified type  Hypokalemia  Hypertension, unspecified type    Medications  potassium chloride SA (KLOR-CON) CR tablet 80 mEq (has no administration in time range)     ED Discharge Orders    None       Note:  This document was prepared using Dragon voice recognition software and may include unintentional dictation errors.   Gilles Chiquito, MD 09/18/20 [phone]

## 2021-10-14 ENCOUNTER — Other Ambulatory Visit: Payer: Self-pay

## 2021-10-14 ENCOUNTER — Emergency Department (HOSPITAL_COMMUNITY): Payer: No Typology Code available for payment source

## 2021-10-14 ENCOUNTER — Emergency Department (HOSPITAL_COMMUNITY)
Admission: EM | Admit: 2021-10-14 | Discharge: 2021-10-14 | Disposition: A | Discharge status: Home / Self Care | Payer: No Typology Code available for payment source | Attending: Emergency Medicine | Admitting: Emergency Medicine

## 2021-10-14 DIAGNOSIS — R079 Chest pain, unspecified: Secondary | ICD-10-CM | POA: Insufficient documentation

## 2021-10-14 DIAGNOSIS — F419 Anxiety disorder, unspecified: Secondary | ICD-10-CM | POA: Insufficient documentation

## 2021-10-14 DIAGNOSIS — R0602 Shortness of breath: Secondary | ICD-10-CM | POA: Insufficient documentation

## 2021-10-14 DIAGNOSIS — F32A Depression, unspecified: Secondary | ICD-10-CM | POA: Insufficient documentation

## 2021-10-14 LAB — CBC
HCT: 42.2 % (ref 39.0–52.0)
Hemoglobin: 13.9 g/dL (ref 13.0–17.0)
MCH: 26.5 pg (ref 26.0–34.0)
MCHC: 32.9 g/dL (ref 30.0–36.0)
MCV: 80.4 fL (ref 80.0–100.0)
Platelets: 261 10*3/uL (ref 150–400)
RBC: 5.25 MIL/uL (ref 4.22–5.81)
RDW: 13.6 % (ref 11.5–15.5)
WBC: 5.1 10*3/uL (ref 4.0–10.5)
nRBC: 0 % (ref 0.0–0.2)

## 2021-10-14 LAB — BASIC METABOLIC PANEL
Anion gap: 12 (ref 5–15)
BUN: 6 mg/dL (ref 6–20)
CO2: 22 mmol/L (ref 22–32)
Calcium: 9.4 mg/dL (ref 8.9–10.3)
Chloride: 106 mmol/L (ref 98–111)
Creatinine, Ser: 0.97 mg/dL (ref 0.61–1.24)
GFR, Estimated: >60 mL/min (ref >60)
Glucose, Bld: 86 mg/dL (ref 70–99)
Potassium: 3.8 mmol/L (ref 3.5–5.1)
Sodium: 140 mmol/L (ref 135–145)

## 2021-10-14 LAB — TROPONIN I (HIGH SENSITIVITY): Troponin I (High Sensitivity): 4 ng/L (ref <18)

## 2021-10-14 MED ORDER — ESCITALOPRAM OXALATE 10 MG PO TABS
10.0000 mg | ORAL_TABLET | Freq: Every day | ORAL | 0 refills | Status: AC
  Filled 2021-10-14: qty 30, 30d supply, fill #0

## 2021-10-14 MED ORDER — HYDROXYZINE HCL 25 MG PO TABS
25.0000 mg | ORAL_TABLET | Freq: Three times a day (TID) | ORAL | 0 refills | Status: AC | PRN
Start: 2021-10-14 — End: ?
  Filled 2021-10-14: qty 30, 10d supply, fill #0

## 2021-10-14 NOTE — ED Provider Notes (Signed)
[facility] EMERGENCY DEPARTMENT Provider Note   CSN: [id number] Arrival date & time: 10/14/21  1636     History  Chief Complaint  Patient presents with   Chest Pain    Nicholas Flynn is a 31 y.o. male.   Chest Pain  31 year old male, he states that he is an "disabled vet", he reports that he no longer works in [employer], he has suffered with anxiety and depression, he has frequent episodes of chest pain and shortness of breath that come on randomly.  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  He denies any leg swelling or risk factors for pulmonary embolism.  The patient has no coughing or fever, he has been evaluated multiple times in the past after review of the medical record for similar complaints.  He reports that he last used LSD recently, denies use of marijuana stating that it makes him more anxious.  He has run out of his hydroxyzine which he was prescribed in the past stating that it did help, he will have been on fluoxetine in the past but states it did not help.  He does not have any suicidal ideations or hallucinations, significant other at the bedside agreeable to the history  Home Medications Prior to Admission medications   Medication Sig Start Date End Date Taking? Authorizing Provider  escitalopram (LEXAPRO) 10 MG tablet Take 1 tablet (10 mg total) by mouth daily. 10/14/21 11/13/21 Yes Eber Hong, MD  hydrOXYzine (ATARAX) 25 MG tablet Take 1 tablet (25 mg total) by mouth every 8 (eight) hours as needed for anxiety. 10/14/21  Yes Eber Hong, MD  traZODone (DESYREL) 50 MG tablet Take 1 tablet (50 mg total) by mouth at bedtime as needed for sleep. 11/10/19   Clapacs, Jackquline Denmark, MD      Allergies    Patient has no known allergies.    Review of Systems   Review of Systems  Cardiovascular:   Positive for chest pain.  All other systems reviewed and are negative.  Physical Exam Updated Vital Signs BP 138/86 (BP Location: Right Arm)   Pulse 98   Temp 97.8 F (36.6 C) (Oral)   Resp 20   SpO2 97%  Physical Exam Vitals and nursing note reviewed.  Constitutional:      General: He is not in acute distress.    Appearance: He is well-developed.  HENT:     Head: Normocephalic and atraumatic.     Mouth/Throat:     Pharynx: No oropharyngeal exudate.  Eyes:     General: No scleral icterus.       Right eye: No discharge.        Left eye: No discharge.     Conjunctiva/sclera: Conjunctivae normal.     Pupils: Pupils are equal, round, and reactive to light.  Neck:     Thyroid: No thyromegaly.     Vascular: No JVD.  Cardiovascular:     Rate and Rhythm: Normal rate and regular rhythm.     Heart sounds: Normal heart sounds. No murmur heard.   No friction rub. No gallop.  Pulmonary:     Effort: Pulmonary effort is normal. No respiratory distress.     Breath sounds: Normal breath sounds. No wheezing or rales.  Abdominal:     General: Bowel sounds  are normal. There is no distension.     Palpations: Abdomen is soft. There is no mass.     Tenderness: There is no abdominal tenderness.  Musculoskeletal:        General: No tenderness. Normal range of motion.     Cervical back: Normal range of motion and neck supple.  Lymphadenopathy:     Cervical: No cervical adenopathy.  Skin:    General: Skin is warm and dry.     Findings: No erythema or rash.  Neurological:     Mental Status: He is alert.     Coordination: Coordination normal.  Psychiatric:     Comments: Mildly anxious appearing, no internal stimuli    ED Results / Procedures / Treatments   Labs (all labs ordered are listed, but only abnormal results are displayed) Labs Reviewed  BASIC METABOLIC PANEL  CBC  TROPONIN I (HIGH SENSITIVITY)    EKG EKG Interpretation  Date/Time:  Sunday October 14 2021 17:08:37  EDT Ventricular Rate:  96 PR Interval:  138 QRS Duration: 80 QT Interval:  338 QTC Calculation: 427 R Axis:   94 Text Interpretation: Normal sinus rhythm Rightward axis Borderline ECG When compared with ECG of 18-Sep-2020 11:21, PREVIOUS ECG IS PRESENT since last tracing no significant change Confirmed by Prentis Langdon ([id number]) on 10/14/2021 7:42:26 PM  Radiology DG Chest 2 View  Result Date: 10/14/2021 CLINICAL DATA:  Chest pain.  Shortness of breath. EXAM: CHEST - 2 VIEW COMPARISON:  Sep 18, 2020 FINDINGS: The heart size and mediastinal contours are within normal limits. Both lungs are clear. The visualized skeletal structures are unremarkable. IMPRESSION: No active cardiopulmonary disease. Electronically Signed   By: David  Williams III M.D.   On: 10/14/2021 17:48    Procedures Procedures    Medications Ordered in ED Medications - No data to display  ED Course/ Medical Decision Making/ A&P                           Medical Decision Making Amount and/or Complexity of Data Reviewed Labs: ordered. Radiology: ordered.  Risk Prescription drug management.   I have discussed with the patient his results, he has an unremarkable EKG chest x-ray and labs.  He is with well-appearing, he has no signs of cardiac dysfunction and his symptoms are nonexertional and not consistent with either pulmonary embolism or acute coronary syndrome.  Doubt aortic dissection, doubt pneumothorax, doubt pericarditis, doubt COPD as he has no wheezing and clear lungs.  We will change his antianxiety medications over to as needed hydroxyzine and daily escitalopram, the patient is totally agreeable to the plan, he will be given referral to family doctor locally, he is agreeable to the plan, no indication for hospitalization or further evaluation at this time.  He was counseled on his use of illicit drugs        Final Clinical Impression(s) / ED Diagnoses Final diagnoses:  Anxiety    Rx / DC Orders ED  Discharge Orders          Ordered    escitalopram (LEXAPRO) 10 MG tablet  Daily        10/14/21 2015    hydrOXYzine (ATARAX) 25 MG tablet  Every 8 hours PRN        06 /04/23 2015              04-29-1994, MD 10/14/21 2020

## 2021-10-14 NOTE — Discharge Instructions (Addendum)
You may take Lexapro daily, follow-up with a family doctor, the hydroxyzine should be used once every 8 hours as needed only for severe anxiety.  All of your testing has been normal and shows no signs of heart attack

## 2021-10-14 NOTE — ED Provider Triage Note (Signed)
Emergency Medicine Provider Triage Evaluation Note  Giovani Neumeister , a 31 y.o. male  was evaluated in triage.  Pt complains of chest pain/upper abdominal pain.  He states that he noticed this today when he woke up at about 1 PM.  He went to bed at about 5 AM.  He states that he had about 8-10 drinks last night.  He denies any fevers.  He states that he does feel anxious and has had chest pain before when he felt anxious.   Physical Exam  BP 138/86 (BP Location: Right Arm)   Pulse 98   Temp 97.8 F (36.6 C) (Oral)   Resp 20   SpO2 97%  Gen:   Awake, no distress   Resp:  Normal effort  MSK:   Moves extremities without difficulty  Other:  Normal speech  Medical Decision Making  Medically screening exam initiated at 5:25 PM.  Appropriate orders placed.  Cline Trawick was informed that the remainder of the evaluation will be completed by another provider, this initial triage assessment does not replace that evaluation, and the importance of remaining in the ED until their evaluation is complete.     Cristina Gong, [place] 10/14/21 1726

## 2021-10-14 NOTE — ED Notes (Signed)
See the doctgors note  I had not finished my assessmen t when the dloctor wanted to talk

## 2021-10-14 NOTE — ED Triage Notes (Signed)
Patient reports chest and back tightness.  Also complains of metallic taste in mouth.  Denies pain radiating. +dizziness no sob nausea.  Patient reports hx of anxiety

## 2021-10-15 ENCOUNTER — Other Ambulatory Visit: Payer: Self-pay

## 2021-10-16 ENCOUNTER — Other Ambulatory Visit: Payer: Self-pay

## 2021-11-15 ENCOUNTER — Other Ambulatory Visit: Payer: Self-pay | Admitting: Emergency Medicine

## 2021-11-18 ENCOUNTER — Other Ambulatory Visit: Payer: Self-pay

## 2021-11-23 ENCOUNTER — Other Ambulatory Visit: Payer: Self-pay

## 2021-11-26 ENCOUNTER — Other Ambulatory Visit (HOSPITAL_COMMUNITY): Payer: Self-pay

## 2021-11-30 ENCOUNTER — Other Ambulatory Visit: Payer: Self-pay

## 2021-12-01 ENCOUNTER — Other Ambulatory Visit: Payer: Self-pay

## 2021-12-03 ENCOUNTER — Other Ambulatory Visit: Payer: Self-pay

## 2021-12-04 ENCOUNTER — Other Ambulatory Visit: Payer: Self-pay

## 2021-12-06 ENCOUNTER — Other Ambulatory Visit: Payer: Self-pay

## 2021-12-11 ENCOUNTER — Other Ambulatory Visit: Payer: Self-pay

## 2022-07-06 IMAGING — CR DG CHEST 2V
2 series · 2 of 2 positions shown · non-contrast
Comparison: September 18, 2020

CLINICAL DATA: Chest pain.  Shortness of breath.

EXAM:
CHEST - 2 VIEW

[chest pa]
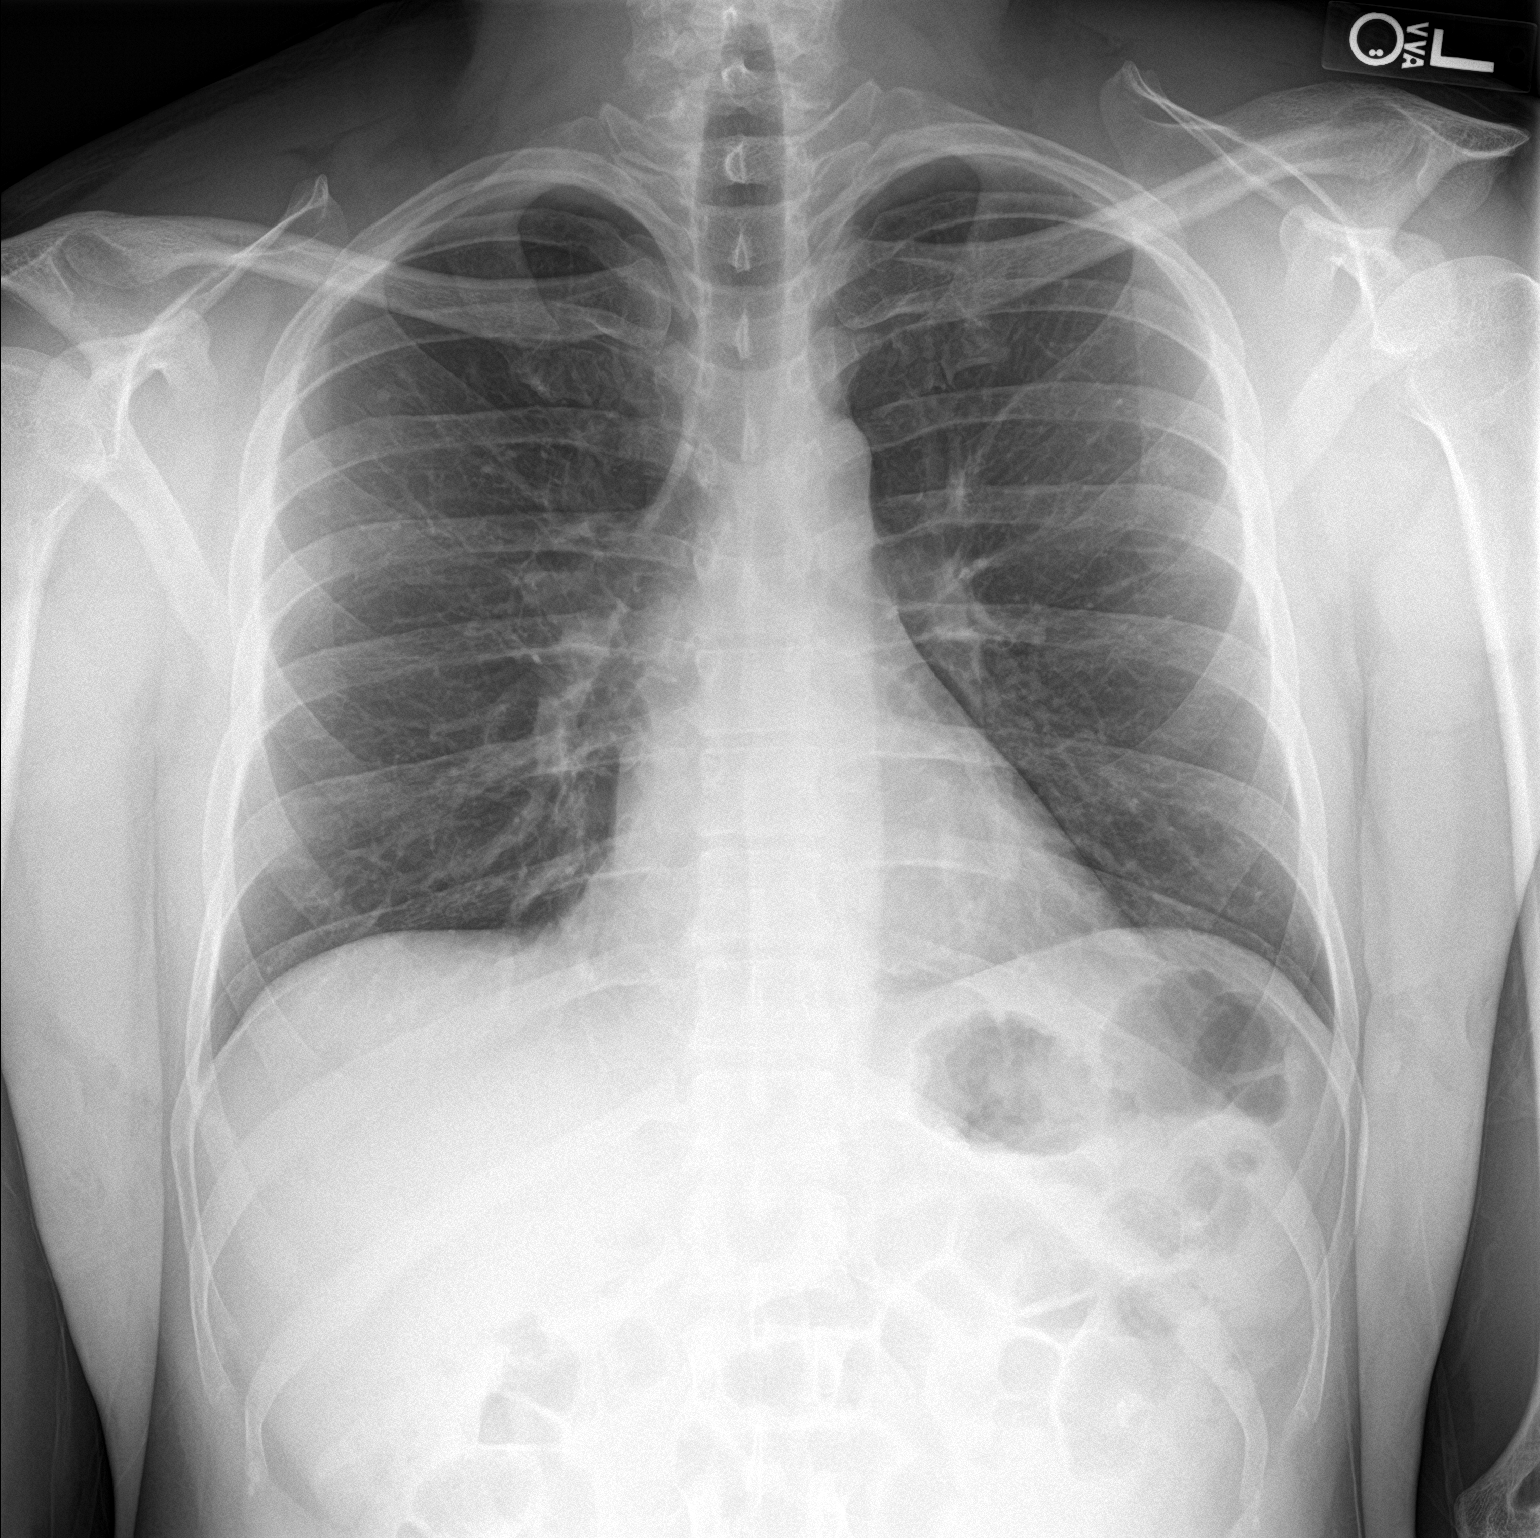

[chest lat]
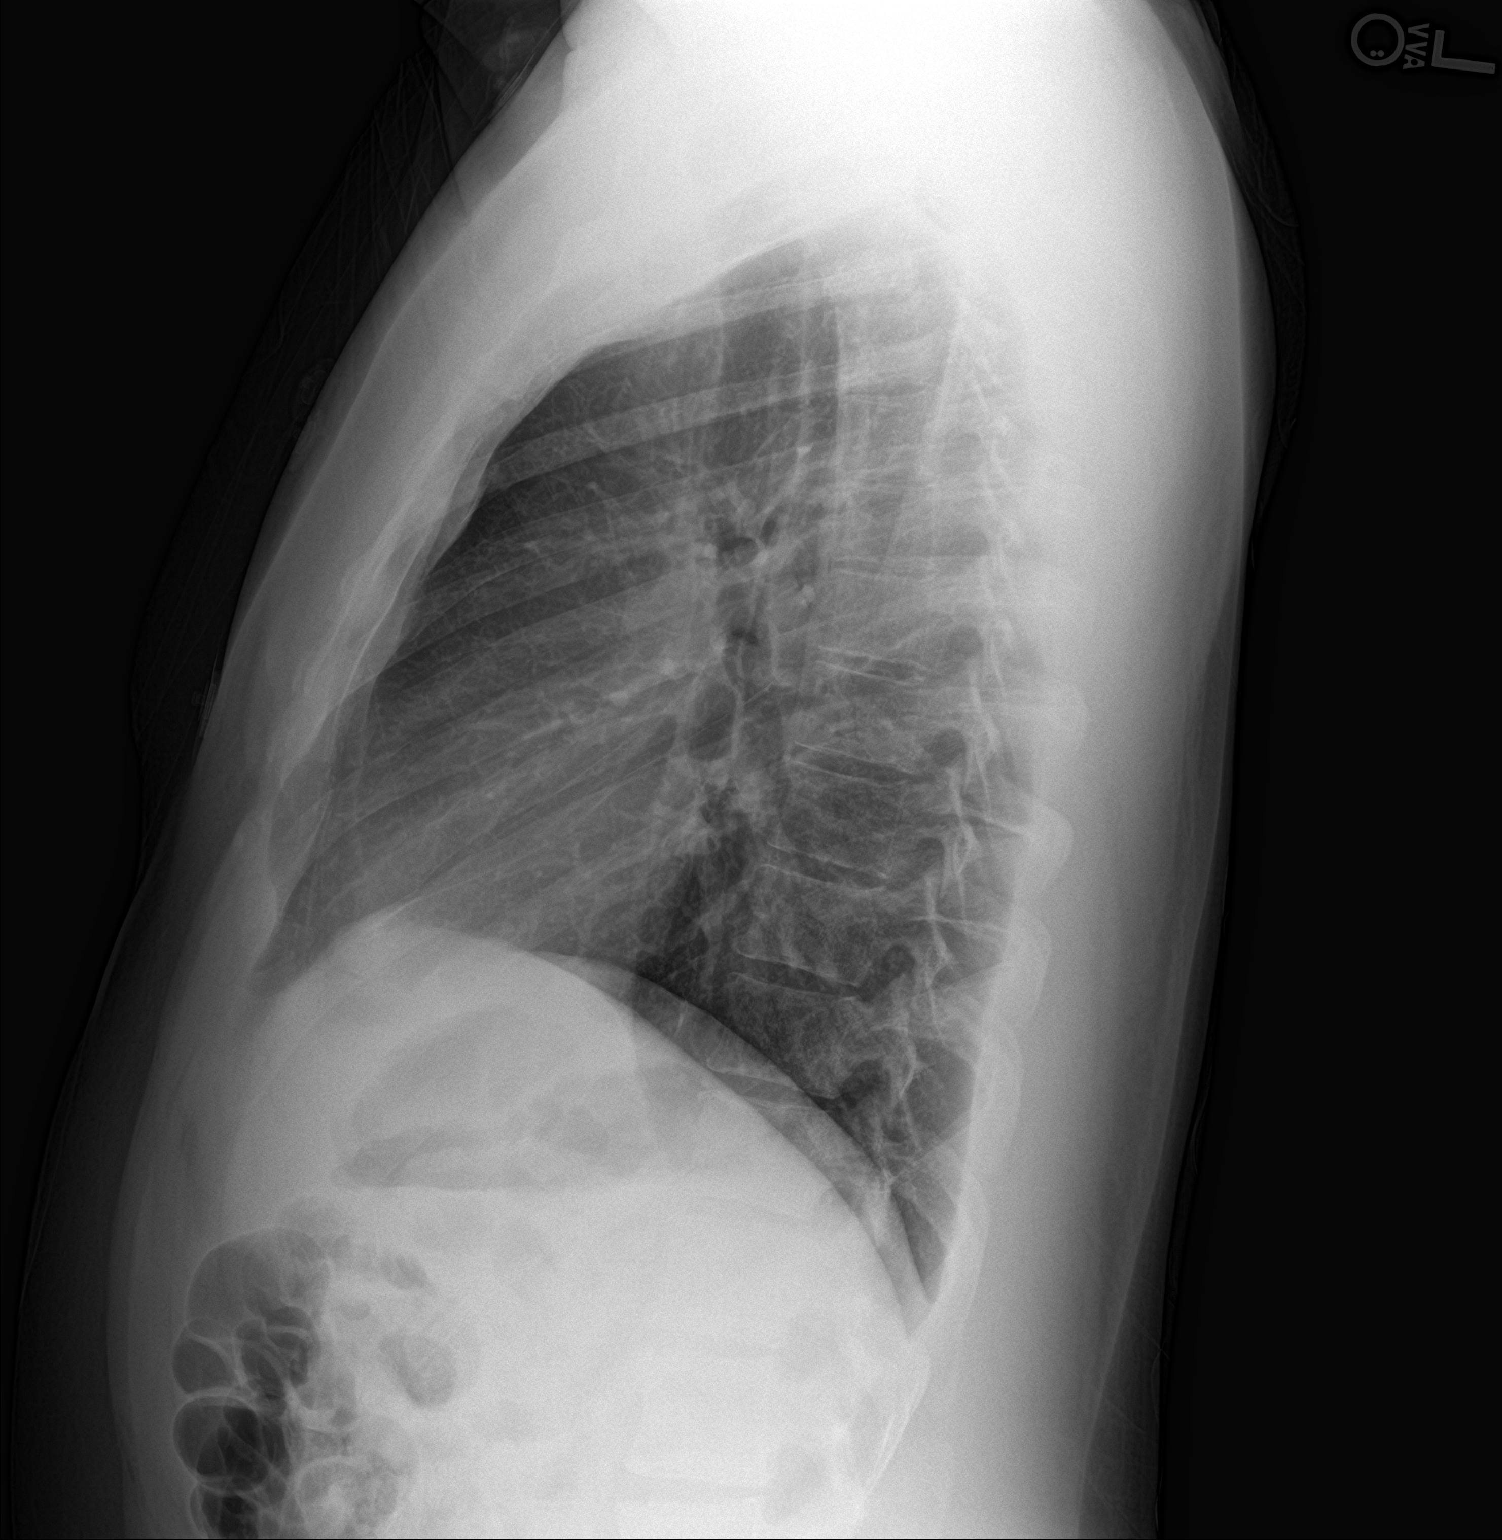

[2 of 2 positions shown; findings below may reference images not displayed]

FINDINGS: The heart size and mediastinal contours are within normal limits.
Both lungs are clear. The visualized skeletal structures are
unremarkable.
IMPRESSION: No active cardiopulmonary disease.

## 2022-08-12 ENCOUNTER — Other Ambulatory Visit: Payer: Self-pay

## 2022-08-12 ENCOUNTER — Encounter (HOSPITAL_BASED_OUTPATIENT_CLINIC_OR_DEPARTMENT_OTHER): Payer: Self-pay | Admitting: Respiratory Therapy

## 2022-08-12 ENCOUNTER — Emergency Department (HOSPITAL_BASED_OUTPATIENT_CLINIC_OR_DEPARTMENT_OTHER): Payer: No Typology Code available for payment source

## 2022-08-12 ENCOUNTER — Emergency Department (HOSPITAL_BASED_OUTPATIENT_CLINIC_OR_DEPARTMENT_OTHER)
Admission: EM | Admit: 2022-08-12 | Discharge: 2022-08-12 | Disposition: A | Discharge status: Home / Self Care | Payer: No Typology Code available for payment source | Attending: Emergency Medicine | Admitting: Emergency Medicine

## 2022-08-12 DIAGNOSIS — R5383 Other fatigue: Secondary | ICD-10-CM | POA: Diagnosis not present

## 2022-08-12 DIAGNOSIS — B349 Viral infection, unspecified: Secondary | ICD-10-CM

## 2022-08-12 DIAGNOSIS — F1490 Cocaine use, unspecified, uncomplicated: Secondary | ICD-10-CM | POA: Insufficient documentation

## 2022-08-12 LAB — TSH: TSH: 1.463 u[IU]/mL (ref 0.350–4.500)

## 2022-08-12 LAB — URINALYSIS, ROUTINE W REFLEX MICROSCOPIC
Bilirubin Urine: NEGATIVE
Glucose, UA: NEGATIVE mg/dL
Hgb urine dipstick: NEGATIVE
Ketones, ur: NEGATIVE mg/dL
Leukocytes,Ua: NEGATIVE
Nitrite: NEGATIVE
Specific Gravity, Urine: 1.026 (ref 1.005–1.030)
pH: 6 (ref 5.0–8.0)

## 2022-08-12 LAB — COMPREHENSIVE METABOLIC PANEL
ALT: 55 U/L — ABNORMAL HIGH (ref 0–44)
AST: 59 U/L — ABNORMAL HIGH (ref 15–41)
Albumin: 4.1 g/dL (ref 3.5–5.0)
Alkaline Phosphatase: 77 U/L (ref 38–126)
Anion gap: 10 (ref 5–15)
BUN: 9 mg/dL (ref 6–20)
CO2: 25 mmol/L (ref 22–32)
Calcium: 8.9 mg/dL (ref 8.9–10.3)
Chloride: 104 mmol/L (ref 98–111)
Creatinine, Ser: 0.83 mg/dL (ref 0.61–1.24)
GFR, Estimated: >60 mL/min (ref >60)
Glucose, Bld: 100 mg/dL — ABNORMAL HIGH (ref 70–99)
Potassium: 3.5 mmol/L (ref 3.5–5.1)
Sodium: 139 mmol/L (ref 135–145)
Total Bilirubin: 0.5 mg/dL (ref 0.3–1.2)
Total Protein: 6.8 g/dL (ref 6.5–8.1)

## 2022-08-12 LAB — CBC WITH DIFFERENTIAL/PLATELET
Abs Immature Granulocytes: 0.01 10*3/uL (ref 0.00–0.07)
Basophils Absolute: 0 10*3/uL (ref 0.0–0.1)
Basophils Relative: 1 %
Eosinophils Absolute: 0 10*3/uL (ref 0.0–0.5)
Eosinophils Relative: 1 %
HCT: 38 % — ABNORMAL LOW (ref 39.0–52.0)
Hemoglobin: 12.6 g/dL — ABNORMAL LOW (ref 13.0–17.0)
Immature Granulocytes: 0 %
Lymphocytes Relative: 44 %
Lymphs Abs: 1.7 10*3/uL (ref 0.7–4.0)
MCH: 26.3 pg (ref 26.0–34.0)
MCHC: 33.2 g/dL (ref 30.0–36.0)
MCV: 79.3 fL — ABNORMAL LOW (ref 80.0–100.0)
Monocytes Absolute: 0.5 10*3/uL (ref 0.1–1.0)
Monocytes Relative: 14 %
Neutro Abs: 1.5 10*3/uL — ABNORMAL LOW (ref 1.7–7.7)
Neutrophils Relative %: 40 %
Platelets: 203 10*3/uL (ref 150–400)
RBC: 4.79 MIL/uL (ref 4.22–5.81)
RDW: 14.1 % (ref 11.5–15.5)
WBC: 3.8 10*3/uL — ABNORMAL LOW (ref 4.0–10.5)
nRBC: 0 % (ref 0.0–0.2)

## 2022-08-12 LAB — RAPID URINE DRUG SCREEN, HOSP PERFORMED
Amphetamines: NOT DETECTED
Barbiturates: NOT DETECTED
Benzodiazepines: NOT DETECTED
Cocaine: POSITIVE — AB
Opiates: NOT DETECTED
Tetrahydrocannabinol: NOT DETECTED

## 2022-08-12 LAB — LIPASE, BLOOD: Lipase: 30 U/L (ref 11–51)

## 2022-08-12 LAB — TROPONIN I (HIGH SENSITIVITY)
Troponin I (High Sensitivity): 4 ng/L (ref <18)
Troponin I (High Sensitivity): 5 ng/L (ref <18)

## 2022-08-12 MED ORDER — SODIUM CHLORIDE 0.9 % IV BOLUS
1000.0000 mL | Freq: Once | INTRAVENOUS | Status: AC
  Administered 2022-08-12: 1000 mL via INTRAVENOUS

## 2022-08-12 MED ORDER — ONDANSETRON HCL 4 MG/2ML IJ SOLN
4.0000 mg | Freq: Once | INTRAMUSCULAR | Status: AC
  Administered 2022-08-12: 4 mg via INTRAVENOUS
  Filled 2022-08-12: qty 2

## 2022-08-12 MED ORDER — SODIUM CHLORIDE 0.9 % IV SOLN: INTRAVENOUS | Status: DC

## 2022-08-12 NOTE — ED Notes (Signed)
Pt states that he is still unable to provide a urine sample

## 2022-08-12 NOTE — ED Triage Notes (Signed)
Pt arrived POV, caox4, ambulatory, NAD. Pt states he woke up last night "drenched in sweat and feeling foggy." Diarrhea yesterday, some nausea today. Pt also c/o ongoing fatigue, weakness, and tingling in hands and feet for a approx 1 month.

## 2022-08-12 NOTE — ED Notes (Signed)
Pt states he is unable to provide urine sample at this time

## 2022-08-12 NOTE — ED Notes (Signed)
Patient verbalizes understanding of discharge instructions. Opportunity for questioning and answers were provided. Patient discharged from ED.  °

## 2022-08-12 NOTE — ED Provider Notes (Signed)
[place] Provider Note   CSN: [id number] Arrival date & time: 08/12/22  [id number]     History  Chief Complaint  Patient presents with   Shortness of Breath    Nicholas Flynn is a 32 y.o. male.  Patient with a constellation of several symptoms of concerns.  States that last night he awoke drenched in sweat feeling foggy today.  Had diarrhea yesterday some nausea today.  Mostly complaining now of ongoing fatigue and weakness tingling in his hands and feet has been ongoing approximately for a month.  Is followed by the [facility] clinic.  Temp here 97.9 blood pressure 152/101 pulse 80 oxygen saturations 100%.  Past medical history noncontributory.  Patient denies any drug use.  Never used tobacco products.       Home Medications Prior to Admission medications   Medication Sig Start Date End Date Taking? Authorizing Provider  escitalopram (LEXAPRO) 10 MG tablet Take 1 tablet (10 mg total) by mouth daily. 10/14/21 11/15/21  Noemi Chapel, MD  hydrOXYzine (ATARAX) 25 MG tablet Take 1 tablet (25 mg total) by mouth every 8 (eight) hours as needed for anxiety. 10/14/21   Noemi Chapel, MD  traZODone (DESYREL) 50 MG tablet Take 1 tablet (50 mg total) by mouth at bedtime as needed for sleep. 11/10/19   Clapacs, Madie Reno, MD      Allergies    Patient has no known allergies.    Review of Systems   Review of Systems  Constitutional:  Positive for diaphoresis and fatigue. Negative for chills and fever.  HENT:  Negative for ear pain and sore throat.   Eyes:  Negative for pain and visual disturbance.  Respiratory:  Negative for cough and shortness of breath.   Cardiovascular:  Negative for chest pain and palpitations.  Gastrointestinal:  Positive for diarrhea and nausea. Negative for abdominal pain and vomiting.  Genitourinary:  Negative for dysuria and hematuria.  Musculoskeletal:  Negative for arthralgias and back pain.  Skin:  Negative for color  change and rash.  Neurological:  Positive for weakness and light-headedness. Negative for seizures and syncope.  All other systems reviewed and are negative.   Physical Exam Updated Vital Signs BP (!) 153/98   Pulse 68   Temp 97.9 F (36.6 C) (Oral)   Resp 14   Ht 1.803 m (5\' 11" )   Wt 99.8 kg   SpO2 99%   BMI 30.68 kg/m  Physical Exam Vitals and nursing note reviewed.  Constitutional:      General: He is not in acute distress.    Appearance: Normal appearance. He is well-developed.  HENT:     Head: Normocephalic and atraumatic.  Eyes:     Extraocular Movements: Extraocular movements intact.     Conjunctiva/sclera: Conjunctivae normal.     Pupils: Pupils are equal, round, and reactive to light.  Cardiovascular:     Rate and Rhythm: Normal rate and regular rhythm.     Heart sounds: No murmur heard. Pulmonary:     Effort: Pulmonary effort is normal. No respiratory distress.     Breath sounds: Normal breath sounds.  Abdominal:     Palpations: Abdomen is soft.     Tenderness: There is no abdominal tenderness.  Musculoskeletal:        General: No swelling.     Cervical back: Normal range of motion and neck supple.  Skin:    General: Skin is warm and dry.     Capillary Refill: Capillary  refill takes less than 2 seconds.  Neurological:     General: No focal deficit present.     Mental Status: He is alert and oriented to person, place, and time.     Cranial Nerves: No cranial nerve deficit.     Sensory: No sensory deficit.     Motor: No weakness.  Psychiatric:        Mood and Affect: Mood normal.     ED Results / Procedures / Treatments   Labs (all labs ordered are listed, but only abnormal results are displayed) Labs Reviewed  CBC WITH DIFFERENTIAL/PLATELET - Abnormal; Notable for the following components:      Result Value   WBC 3.8 (*)    Hemoglobin 12.6 (*)    HCT 38.0 (*)    MCV 79.3 (*)    Neutro Abs 1.5 (*)    All other components within normal limits   COMPREHENSIVE METABOLIC PANEL - Abnormal; Notable for the following components:   Glucose, Bld 100 (*)    AST 59 (*)    ALT 55 (*)    All other components within normal limits  RAPID URINE DRUG SCREEN, HOSP PERFORMED - Abnormal; Notable for the following components:   Cocaine POSITIVE (*)    All other components within normal limits  URINALYSIS, ROUTINE W REFLEX MICROSCOPIC - Abnormal; Notable for the following components:   Protein, ur TRACE (*)    All other components within normal limits  LIPASE, BLOOD  TSH  TROPONIN I (HIGH SENSITIVITY)  TROPONIN I (HIGH SENSITIVITY)    EKG EKG Interpretation  Date/Time:  Monday August 12 2022 08:23:50 EDT Ventricular Rate:  79 PR Interval:  149 QRS Duration: 91 QT Interval:  364 QTC Calculation: 418 R Axis:   71 Text Interpretation: Sinus rhythm Anteroseptal infarct, old NO SIGNIFICANT CHANGE SINCE LAST TRACING YESTERDAY Confirmed by Fredia Sorrow [phone]) on 08/12/2022 8:27:58 AM  Radiology DG Chest Port 1 View  Result Date: 08/12/2022 CLINICAL DATA:  Lightheaded EXAM: PORTABLE CHEST - 1 VIEW COMPARISON:  10/14/2021 FINDINGS: Cardiac silhouette is unremarkable. No pneumothorax or pleural effusion. The lungs are clear. The visualized skeletal structures are unremarkable. IMPRESSION: No acute cardiopulmonary process. Electronically Signed   By: Sammie Bench M.D.   On: 08/12/2022 09:06   CT Head Wo Contrast  Result Date: 08/12/2022 CLINICAL DATA:  Mental status change, unknown cause. EXAM: CT HEAD WITHOUT CONTRAST TECHNIQUE: Contiguous axial images were obtained from the base of the skull through the vertex without intravenous contrast. RADIATION DOSE REDUCTION: This exam was performed according to the departmental dose-optimization program which includes automated exposure control, adjustment of the mA and/or kV according to patient size and/or use of iterative reconstruction technique. COMPARISON:  None Available. FINDINGS: Brain: No acute  intracranial hemorrhage. Gray-white differentiation is preserved. Age advanced, generalized volume loss. No hydrocephalus or extra-axial collection. No mass effect or midline shift. Partially empty sella. Vascular: No hyperdense vessel or unexpected calcification. Skull: No calvarial fracture or suspicious bone lesion. Skull base is unremarkable. Sinuses/Orbits: Trace opacification of the ethmoid air cells. Orbits are unremarkable. Other: None. IMPRESSION: 1. No acute intracranial abnormality. 2. Age-advanced, generalized volume loss. Electronically Signed   By: Emmit Alexanders M.D.   On: 08/12/2022 09:05    Procedures Procedures    Medications Ordered in ED Medications  0.9 %  sodium chloride infusion (0 mLs Intravenous Stopped 08/12/22 1146)  sodium chloride 0.9 % bolus 1,000 mL (0 mLs Intravenous Stopped 08/12/22 1015)  ondansetron (ZOFRAN) injection 4  mg (4 mg Intravenous Given 08/12/22 0915)    ED Course/ Medical Decision Making/ A&P                             Medical Decision Making Amount and/or Complexity of Data Reviewed Labs: ordered. Radiology: ordered.  Risk Prescription drug management.   Patient feeling better after IV fluids.  Troponins x 2 negative thyroid-stimulating hormone normal.  Urine drug screen positive for cocaine.  Urinalysis negative for urinary tract infection.  CBC white count down a little bit at 3.8 which may go along with a viral illness.  Hemoglobin 12.6 platelets 203.  Complete metabolic panel without significant abnormalities renal functions normal.  Lipase also normal.  Chest x-ray negative for any acute findings.  And head CT negative for any acute findings.  Patient will be treated symptomatically.  Follow-up with [facility] clinic.  Work note provided. Final Clinical Impression(s) / ED Diagnoses Final diagnoses:  Other fatigue  Viral illness    Rx / DC Orders ED Discharge Orders     None         Fredia Sorrow, MD 08/12/22 1150

## 2022-08-12 NOTE — Discharge Instructions (Signed)
Workup for your symptoms without any acute findings.  Suggest she make an appointment follow-up with the clinic.  Thyroid-stimulating hormone was normal.  Repeat troponin was normal.  Head CT negative chest x-ray negative labs without any significant abnormalities.  Cocaine was in the system could possibly be playing a role.  Work note provided.

## 2023-02-13 ENCOUNTER — Other Ambulatory Visit: Payer: Self-pay

## 2023-02-13 ENCOUNTER — Encounter (HOSPITAL_BASED_OUTPATIENT_CLINIC_OR_DEPARTMENT_OTHER): Payer: Self-pay | Admitting: Urology

## 2023-02-13 ENCOUNTER — Emergency Department (HOSPITAL_BASED_OUTPATIENT_CLINIC_OR_DEPARTMENT_OTHER)
Admission: EM | Admit: 2023-02-13 | Discharge: 2023-02-13 | Disposition: A | Discharge status: Home / Self Care | Payer: No Typology Code available for payment source | Attending: Emergency Medicine | Admitting: Emergency Medicine

## 2023-02-13 DIAGNOSIS — U071 COVID-19: Secondary | ICD-10-CM | POA: Diagnosis not present

## 2023-02-13 DIAGNOSIS — R0981 Nasal congestion: Secondary | ICD-10-CM | POA: Diagnosis present

## 2023-02-13 NOTE — Discharge Instructions (Signed)
Please follow-up with your primary care provider in the next few days regarding recent symptoms and ER visit.  Today you tested positive for COVID and your exam is reassuring.  We agreed to forego any labs or imaging at this time.  Please use Tylenol every 6 hours as needed for pain and drink plenty of fluids and eat food as tolerated.  If symptoms change or worsen please return to ER.

## 2023-02-13 NOTE — ED Triage Notes (Signed)
Pt states fever that started yesterday but was feeling short of breath today +COVID test at home today  States SPO2 at home 85-93   No h/o resp illness , SPO2 100% qt triage  RT at bedside

## 2023-02-13 NOTE — ED Provider Notes (Signed)
[facility] AT [facility] Provider Note   CSN: [id number] Arrival date & time: 02/13/23  1332     History  Chief Complaint  Patient presents with   Covid Positive    Nicholas Flynn is a 32 y.o. 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  Patient states over the past 5 days he is been feeling ill with feeling nauseous with loose stools and just feeling overall weak.  Patient took a COVID test at home today and that was positive.  Patient states he has an SpO2 monitor at home however read it wrong and was read in the heart rate when he noticed that he was 85-93 and mistook this for his O2 sat and came to the ER to be checked out.  Patient denies shortness of breath, chest pain, change sensation/motor skills, abdominal pain.  Patient is been to eat and drink without issue.  Home Medications Prior to Admission medications   Medication Sig Start Date End Date Taking? Authorizing Provider  escitalopram (LEXAPRO) 10 MG tablet Take 1 tablet (10 mg total) by mouth daily. 10/14/21 11/15/21  Eber Hong, MD  hydrOXYzine (ATARAX) 25 MG tablet Take 1 tablet (25 mg total) by mouth every 8 (eight) hours as needed for anxiety. 10/14/21   Eber Hong, MD  traZODone (DESYREL) 50 MG tablet Take 1 tablet (50 mg total) by mouth at bedtime as needed for sleep. 11/10/19   Clapacs, Jackquline Denmark, MD      Allergies    Patient has no known allergies.    Review of Systems   Review of Systems  Physical Exam Updated Vital Signs BP 131/89   Pulse 85   Temp 98.4 F (36.9 C) (Oral)   Resp 18   Ht 5\' 11"  (1.803 m)   Wt 99.8 kg   SpO2 99%   BMI 30.69 kg/m  Physical Exam Constitutional:      General: He is not in acute distress.    Comments: Resting comfortably on phone in room  HENT:     Right Ear: Tympanic membrane, ear canal and external ear normal.     Left Ear: Tympanic membrane, ear canal and external ear  normal.     Nose: Congestion present. No rhinorrhea.     Mouth/Throat:     Mouth: Mucous membranes are moist.     Pharynx: Posterior oropharyngeal erythema present. No oropharyngeal exudate.  Eyes:     Extraocular Movements: Extraocular movements intact.     Conjunctiva/sclera: Conjunctivae normal.     Pupils: Pupils are equal, round, and reactive to light.  Cardiovascular:     Rate and Rhythm: Normal rate and regular rhythm.     Pulses: Normal pulses.     Heart sounds: Normal heart sounds.  Pulmonary:     Effort: Pulmonary effort is normal. No respiratory distress.     Breath sounds: Normal breath sounds.  Abdominal:     General: Abdomen is flat.     Palpations: Abdomen is soft.     Tenderness: There is no abdominal tenderness. There is no guarding.  Musculoskeletal:        General: Normal range of motion.     Cervical back: Normal range of motion.  Skin:    General: Skin is warm.     Capillary Refill: Capillary refill takes less than 2 seconds.  Neurological:     General: No focal deficit present.     Mental Status: He  is alert and oriented to person, place, and time.  Psychiatric:        Mood and Affect: Mood normal.     ED Results / Procedures / Treatments   Labs (all labs ordered are listed, but only abnormal results are displayed) Labs Reviewed - No data to display  EKG None  Radiology No results found.  Procedures Procedures    Medications Ordered in ED Medications - No data to display  ED Course/ Medical Decision Making/ A&P                                 Medical Decision Making  Nicholas Flynn 32 y.o. presented today for URI like symptoms. Working DDx that I considered at this time includes, but not limited to, viral illness, pharyngitis, mono, sinusitis, electrolyte abnormality, AOM.  R/o DDx: pharyngitis, mono, sinusitis, electrolyte abnormality, AOM: these diagnoses are not consistent with patient's history, presentation, physical exam,  labs/imaging findings.  Review of prior external notes: 08/12/2022 ED  Unique Tests and My Interpretation:  None  Discussion with Independent Historian: None  Discussion of Management of Tests: None  Risk: Low: based on diagnostic testing/clinical impression and treatment plan  Risk Stratification Score: None  Plan: On exam patient was no acute distress with stable vitals.  Patient physical exam was unremarkable.  Patient did test positive for COVID at home which would explain his symptoms.  I spoke to the patient and we agreed to forego any labs or imaging at this time as patient symptoms are most likely COVID-related and he is a young healthy individual.  Patient dates he can just eat food and drink fluids and use Tylenol or Motrin at home which seems reasonable.  Triage note states that patient's SpO2 was 85 to 93% however I spoke to the patient he states that he read the SpO2 wrong and got mixed up with a heart rate.  Patient has not been hypoxic here and his lungs are clear to auscultation bilaterally with good air movement.  Patient is young healthy individual with symptoms lasting for about 5 days and so does not need Paxlovid at this time to which patient agreed.  Will discharge with primary care follow-up and symptomatic management.  Work note given.  Patient was given return precautions.patient stable for discharge at this time.  Patient verbalized understanding of plan.  This chart was dictated using voice recognition software.  Despite best efforts to proofread,  errors can occur which can change the documentation meaning.         Final Clinical Impression(s) / ED Diagnoses Final diagnoses:  COVID    Rx / DC Orders ED Discharge Orders     None         Remi Deter 02/13/23 1442    Linwood Dibbles, MD 02/13/23 1446

## 2024-01-06 ENCOUNTER — Encounter (HOSPITAL_BASED_OUTPATIENT_CLINIC_OR_DEPARTMENT_OTHER): Payer: Self-pay | Admitting: Emergency Medicine

## 2024-01-06 ENCOUNTER — Emergency Department (HOSPITAL_BASED_OUTPATIENT_CLINIC_OR_DEPARTMENT_OTHER)
Admission: EM | Admit: 2024-01-06 | Discharge: 2024-01-06 | Disposition: A | Discharge status: Home / Self Care | Attending: Emergency Medicine | Admitting: Emergency Medicine

## 2024-01-06 ENCOUNTER — Other Ambulatory Visit: Payer: Self-pay

## 2024-01-06 DIAGNOSIS — K602 Anal fissure, unspecified: Secondary | ICD-10-CM | POA: Insufficient documentation

## 2024-01-06 DIAGNOSIS — K625 Hemorrhage of anus and rectum: Secondary | ICD-10-CM | POA: Insufficient documentation

## 2024-01-06 LAB — CBC
HCT: 44.2 % (ref 39.0–52.0)
Hemoglobin: 14.6 g/dL (ref 13.0–17.0)
MCH: 26.5 pg (ref 26.0–34.0)
MCHC: 33 g/dL (ref 30.0–36.0)
MCV: 80.4 fL (ref 80.0–100.0)
Platelets: 256 K/uL (ref 150–400)
RBC: 5.5 MIL/uL (ref 4.22–5.81)
RDW: 14.1 % (ref 11.5–15.5)
WBC: 4.2 K/uL (ref 4.0–10.5)
nRBC: 0 % (ref 0.0–0.2)

## 2024-01-06 LAB — COMPREHENSIVE METABOLIC PANEL WITH GFR
ALT: 28 U/L (ref 0–44)
AST: 37 U/L (ref 15–41)
Albumin: 4.5 g/dL (ref 3.5–5.0)
Alkaline Phosphatase: 99 U/L (ref 38–126)
Anion gap: 13 (ref 5–15)
BUN: 11 mg/dL (ref 6–20)
CO2: 22 mmol/L (ref 22–32)
Calcium: 9.5 mg/dL (ref 8.9–10.3)
Chloride: 103 mmol/L (ref 98–111)
Creatinine, Ser: 0.88 mg/dL (ref 0.61–1.24)
GFR, Estimated: >60 mL/min (ref >60)
Glucose, Bld: 124 mg/dL — ABNORMAL HIGH (ref 70–99)
Potassium: 3.8 mmol/L (ref 3.5–5.1)
Sodium: 138 mmol/L (ref 135–145)
Total Bilirubin: 0.5 mg/dL (ref 0.0–1.2)
Total Protein: 7.5 g/dL (ref 6.5–8.1)

## 2024-01-06 LAB — OCCULT BLOOD X 1 CARD TO LAB, STOOL: Fecal Occult Bld: POSITIVE — AB

## 2024-01-06 LAB — ABO/RH: ABO/RH(D): O POS

## 2024-01-06 NOTE — ED Notes (Signed)
 Reviewed discharge instructions and home care with pt. Pt verbalized understanding and had no further questions. Pt exited ED without complications.

## 2024-01-06 NOTE — ED Provider Notes (Signed)
 [place] EMERGENCY DEPARTMENT AT [facility] Provider Note   CSN: [id number] Arrival date & time: 01/06/24  1245     History Chief Complaint  Patient presents with   Rectal Bleeding   HPI: Nicholas Flynn is a 33 y.o. male with history perinent depression who presents complaining of rectal bleeding. Patient arrived via POV.  History provided by patient.  Interpreter not required for this encounter.  Patient reports that he has been in his normal state of health over the past several days.  Reports that he has previously had a small amount of bright red blood when wiping after having a bowel movement particularly after he has bouts of constipation.  Reports that he did have some constipation and straining with bowel movements yesterday.  Reports that he woke up today and had some malaise, however felt well enough to go to the gym.  While he was at the gym he had the urge to use the restroom, and noted that he had a formed bowel movement as well as drops of bright red blood in the toilet bowl.  Reports that after going home he had a second bowel movement similar with formed brown stool as well as bright red blood per rectum.  He denies any black stool.  Reports that he has not had any new or different meals recently.  Does report that he has a sister as well as a half sister with inflammatory bowel disease, however he has not personally been diagnosed with inflammatory bowel disease.  Due to having 2 bloody bowel movements and a larger volume of blood that he has previously experienced, he decided to come to the emergency department for further evaluation.  Patient's recorded medical, surgical, social, medication list and allergies were reviewed in the Snapshot window as part of the initial history.   Review of Systems   ROS as per HPI  Physical Exam Updated Vital Signs BP (!) 125/90   Pulse 75   Temp 98.1 F (36.7 C) (Oral)   Resp 18   SpO2 96%  Physical Exam Vitals and  nursing note reviewed. Exam conducted with a chaperone present.  Constitutional:      General: He is not in acute distress.    Appearance: He is well-developed.  HENT:     Head: Normocephalic and atraumatic.  Eyes:     Conjunctiva/sclera: Conjunctivae normal.  Cardiovascular:     Rate and Rhythm: Normal rate and regular rhythm.     Heart sounds: No murmur heard. Pulmonary:     Effort: Pulmonary effort is normal. No respiratory distress.     Breath sounds: Normal breath sounds.  Abdominal:     Palpations: Abdomen is soft.     Tenderness: There is no abdominal tenderness. There is no guarding or rebound.  Genitourinary:    Rectum: Guaiac result positive (No gross blood). Anal fissure (At 5 o'clock position as indicated on diagram) present. No mass, tenderness, external hemorrhoid or internal hemorrhoid. Normal anal tone.   Musculoskeletal:        General: No swelling.     Cervical back: Neck supple.  Skin:    General: Skin is warm and dry.     Capillary Refill: Capillary refill takes less than 2 seconds.  Neurological:     Mental Status: He is alert.  Psychiatric:        Mood and Affect: Mood normal.      ED Course/ Medical Decision Making/ A&P    Procedures Procedures  Medications Ordered in ED Medications - No data to display  Medical Decision Making:   Nicholas Flynn is a 33 y.o. male who presents for as per above.  Physical exam is pertinent for anal fissure at 5 o'clock position with grossly negative, guaiac positive stool.   The differential includes but is not limited to fissure, hemorrhoid, constipation, inflammatory bowel disease, diverticulitis, colitis, diverticular bleed, hemorrhage, anemia.  Independent historian: None  External data reviewed: Labs: Reviewed prior hemoglobin, patient most recent prior 12.6 1-year ago, however previously patient with hemoglobin between 13-15  Labs: Ordered, Independent interpretation, and Details: CBC without  leukocytosis, anemia, thrombocytopenia, hemoglobin stable to improved on comparison to prior.  CMP without AKI, Reginald to gentian, emergent LFT abnormality.  Stool guaiac positive on Hemoccult test  Radiology: Not indicated No results found.  EKG/Medicine tests: Not indicated            Interventions: None  See the EMR for full details regarding lab and imaging results.  Patient overall well-appearing on exam, prior to my evaluation, patient had labs obtained including hemoglobin which is WNL, patient was also hemodynamically stable with normotensive to slightly hypertensive pressures, therefore doubt large-volume hemorrhage or acute blood loss anemia.  Patient reports that he has had recent straining, and had formed stool with liquid blood in the toilet in addition to formed stool, feel that lower source of bleeding such as hemorrhoids or fissure is most likely etiology of symptoms, patient's exam was consistent with anal fissure, likely induced by his recent constipation.  Patient describes history of blood with wiping which is also consistent with having intermittent hemorrhoids or fissures from straining.  Will patient does have a family history of IBD, given he has no abdominal pain, is not having associated diarrhea, has a anal fissure on exam, do not feel that patient warrants further evaluation for IBD at this time, however this should he persistence of his symptoms, or development of bloody diarrhea, that would be a indication of reevaluation or need for referral to gastroenterology.  Offered referral to gastroenterology, however patient reports that he has follow-up scheduled with his PCP for next week, and if his symptoms persist he will discuss with his PCP for a gastroenterology referral.  I believe that this is reasonable.  Discussed symptomatic care for anal fissure including increased fiber in diet versus MiraLAX, as well as sitz bath's.  Patient expressed understanding, comfortable with  plan, discharged in stable condition.  Presentation is most consistent with acute uncomplicated illness  Discussion of management or test interpretations with external provider(s): Not indicated  Risk Drugs:None  Disposition: DISCHARGE: I believe that the patient is safe for discharge home with outpatient follow-up. Patient was informed of all pertinent physical exam, laboratory, and imaging findings.  Patient's suspected etiology of their symptom presentation was discussed with the patient and all questions were answered. We discussed following up with PCP. I provided thorough ED return precautions. The patient feels safe and comfortable with this plan.  MDM generated using voice dictation software and may contain dictation errors.  Please contact me for any clarification or with any questions.  Clinical Impression:  1. Anal fissure   2. Rectal bleeding      Discharge   Final Clinical Impression(s) / ED Diagnoses Final diagnoses:  Anal fissure  Rectal bleeding    Rx / DC Orders ED Discharge Orders     None        Rogelia Jerilynn RAMAN, MD 01/06/24 [phone]

## 2024-01-06 NOTE — ED Triage Notes (Signed)
 C/o bright red blood in stool this morning. Reports slight nausea and fatigue. Denies abd pain.

## 2024-01-06 NOTE — Discharge Instructions (Addendum)
 [facility]  Thank you for allowing us  to take care of you today.  You came to the Emergency Department today because you had blood in your stool and 2 prior bowel movements today.  Otherwise you are not having abdominal pain and your abdomen is not tender when we press on it.  Your blood counts are normal, and your blood pressure is reassuring here in the emergency department.  On your rectal exam you did not have any blood that we could appreciate internally, however you did have a slight tear around your anus called a anal fissure with a small amount of blood.  This was likely the source of your bleeding earlier today.  This often can occur due to constipation and straining with bowel movements, which you acknowledged that you had yesterday.  We would recommend increasing your fiber intake, either with dietary fiber, or with MiraLAX.  We also recommend doing sitz bath's and a small amount of warm water, please see the additional handout attached.  We recommend following up with your primary care doctor for further evaluation.  To-Do: 1. Please follow-up with your primary doctor within 2 days / as soon as possible.    Please return to the Emergency Department or call 911 if you experience have worsening of your symptoms, or do not get better, rectal bleeding associated with belly pain, chest pain, shortness of breath, severe or significantly worsening pain, high fever, severe confusion, pass out or have any reason to think that you need emergency medical care.   We hope you feel better soon.   Mitzie Later, MD Department of Emergency Medicine [facility]
